# Patient Record
Sex: Female | Born: 1985 | Race: Black or African American | Hispanic: No | Marital: Single | Smoking: Never smoker
Health system: Southern US, Community
[De-identification: ages and names within clinical notes are randomized; demographics above are authoritative.]

## PROBLEM LIST (undated history)

## (undated) DIAGNOSIS — Z789 Other specified health status: Secondary | ICD-10-CM

## (undated) HISTORY — PX: TUBAL LIGATION: SHX77

---
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
## 2007-09-21 ENCOUNTER — Inpatient Hospital Stay (HOSPITAL_COMMUNITY): Admission: AD | Admit: 2007-09-21 | Discharge: 2007-09-21 | Payer: Self-pay | Admitting: Obstetrics & Gynecology

## 2007-12-14 ENCOUNTER — Ambulatory Visit (HOSPITAL_COMMUNITY): Admission: RE | Admit: 2007-12-14 | Discharge: 2007-12-14 | Payer: Self-pay | Admitting: Obstetrics and Gynecology

## 2008-05-13 ENCOUNTER — Ambulatory Visit (HOSPITAL_COMMUNITY): Admission: RE | Admit: 2008-05-13 | Discharge: 2008-05-13 | Payer: Self-pay | Admitting: Orthopedic Surgery

## 2008-05-16 ENCOUNTER — Ambulatory Visit: Payer: Self-pay | Admitting: Physician Assistant

## 2008-05-16 ENCOUNTER — Inpatient Hospital Stay (HOSPITAL_COMMUNITY): Admission: AD | Admit: 2008-05-16 | Discharge: 2008-05-19 | Payer: Self-pay | Admitting: Obstetrics & Gynecology

## 2010-10-15 ENCOUNTER — Emergency Department (HOSPITAL_COMMUNITY)
Admission: EM | Admit: 2010-10-15 | Discharge: 2010-10-15 | Payer: Self-pay | Source: Home / Self Care | Admitting: Emergency Medicine

## 2010-11-17 ENCOUNTER — Inpatient Hospital Stay (HOSPITAL_COMMUNITY)
Admission: AD | Admit: 2010-11-17 | Discharge: 2010-11-17 | Disposition: A | Discharge status: Still In Hospital | Payer: Self-pay | Source: Home / Self Care | Attending: Obstetrics and Gynecology | Admitting: Obstetrics and Gynecology

## 2010-11-23 LAB — URINE MICROSCOPIC-ADD ON

## 2010-11-23 LAB — CBC
HCT: 34.5 % — ABNORMAL LOW (ref 36.0–46.0)
Hemoglobin: 11.6 g/dL — ABNORMAL LOW (ref 12.0–15.0)
MCH: 28.5 pg (ref 26.0–34.0)
MCHC: 33.6 g/dL (ref 30.0–36.0)
MCV: 84.8 fL (ref 78.0–100.0)
Platelets: 208 10*3/uL (ref 150–400)
RBC: 4.07 MIL/uL (ref 3.87–5.11)
RDW: 13 % (ref 11.5–15.5)
WBC: 8.7 10*3/uL (ref 4.0–10.5)

## 2010-11-23 LAB — WET PREP, GENITAL
Trich, Wet Prep: NONE SEEN
Yeast Wet Prep HPF POC: NONE SEEN

## 2010-11-23 LAB — URINALYSIS, ROUTINE W REFLEX MICROSCOPIC
Bilirubin Urine: NEGATIVE
Hgb urine dipstick: NEGATIVE
Ketones, ur: NEGATIVE mg/dL
Nitrite: NEGATIVE
Protein, ur: NEGATIVE mg/dL
Specific Gravity, Urine: 1.025 (ref 1.005–1.030)
Urine Glucose, Fasting: NEGATIVE mg/dL
Urobilinogen, UA: 0.2 mg/dL (ref 0.0–1.0)
pH: 6.5 (ref 5.0–8.0)

## 2010-11-23 LAB — GC/CHLAMYDIA PROBE AMP, GENITAL
Chlamydia, DNA Probe: NEGATIVE
GC Probe Amp, Genital: NEGATIVE

## 2010-11-23 LAB — ABO/RH: ABO/RH(D): A POS

## 2010-12-03 LAB — URINE CULTURE
Colony Count: >=100000
Culture  Setup Time: 201201102229

## 2011-01-19 LAB — URINALYSIS, ROUTINE W REFLEX MICROSCOPIC
Bilirubin Urine: NEGATIVE
Glucose, UA: NEGATIVE mg/dL
Hgb urine dipstick: NEGATIVE
Ketones, ur: NEGATIVE mg/dL
Nitrite: NEGATIVE
Protein, ur: NEGATIVE mg/dL
Specific Gravity, Urine: 1.01 (ref 1.005–1.030)
Urobilinogen, UA: 0.2 mg/dL (ref 0.0–1.0)
pH: 6 (ref 5.0–8.0)

## 2011-01-19 LAB — URINE CULTURE
Colony Count: 30000
Culture  Setup Time: 201112090122

## 2011-01-19 LAB — POCT PREGNANCY, URINE: Preg Test, Ur: POSITIVE

## 2011-02-10 ENCOUNTER — Other Ambulatory Visit: Payer: Self-pay | Admitting: Family Medicine

## 2011-02-10 DIAGNOSIS — O3660X0 Maternal care for excessive fetal growth, unspecified trimester, not applicable or unspecified: Secondary | ICD-10-CM

## 2011-02-10 DIAGNOSIS — Z3689 Encounter for other specified antenatal screening: Secondary | ICD-10-CM

## 2011-02-10 DIAGNOSIS — O0930 Supervision of pregnancy with insufficient antenatal care, unspecified trimester: Secondary | ICD-10-CM

## 2011-02-16 ENCOUNTER — Ambulatory Visit (HOSPITAL_COMMUNITY)
Admission: RE | Admit: 2011-02-16 | Discharge: 2011-02-16 | Disposition: A | Discharge status: Home / Self Care | Payer: Medicaid Other | Source: Ambulatory Visit | Attending: Family Medicine | Admitting: Family Medicine

## 2011-02-16 DIAGNOSIS — O3660X0 Maternal care for excessive fetal growth, unspecified trimester, not applicable or unspecified: Secondary | ICD-10-CM | POA: Insufficient documentation

## 2011-02-16 DIAGNOSIS — O0930 Supervision of pregnancy with insufficient antenatal care, unspecified trimester: Secondary | ICD-10-CM

## 2011-02-16 DIAGNOSIS — Z3689 Encounter for other specified antenatal screening: Secondary | ICD-10-CM

## 2011-02-16 DIAGNOSIS — Z1389 Encounter for screening for other disorder: Secondary | ICD-10-CM | POA: Insufficient documentation

## 2011-02-16 DIAGNOSIS — Z363 Encounter for antenatal screening for malformations: Secondary | ICD-10-CM | POA: Insufficient documentation

## 2011-02-16 DIAGNOSIS — O358XX0 Maternal care for other (suspected) fetal abnormality and damage, not applicable or unspecified: Secondary | ICD-10-CM | POA: Insufficient documentation

## 2011-03-01 ENCOUNTER — Inpatient Hospital Stay (HOSPITAL_COMMUNITY)
Admission: AD | Admit: 2011-03-01 | Discharge: 2011-03-01 | Disposition: A | Discharge status: Home / Self Care | Payer: Medicaid Other | Source: Ambulatory Visit | Attending: Obstetrics & Gynecology | Admitting: Obstetrics & Gynecology

## 2011-03-01 DIAGNOSIS — W010XXA Fall on same level from slipping, tripping and stumbling without subsequent striking against object, initial encounter: Secondary | ICD-10-CM | POA: Insufficient documentation

## 2011-03-01 DIAGNOSIS — O99891 Other specified diseases and conditions complicating pregnancy: Secondary | ICD-10-CM | POA: Insufficient documentation

## 2011-03-01 DIAGNOSIS — Y92009 Unspecified place in unspecified non-institutional (private) residence as the place of occurrence of the external cause: Secondary | ICD-10-CM | POA: Insufficient documentation

## 2011-03-01 DIAGNOSIS — Y93F1 Activity, caregiving, bathing: Secondary | ICD-10-CM | POA: Insufficient documentation

## 2011-03-01 LAB — URINE MICROSCOPIC-ADD ON

## 2011-03-01 LAB — ABO/RH: ABO/RH(D): A POS

## 2011-03-01 LAB — URINALYSIS, ROUTINE W REFLEX MICROSCOPIC
Bilirubin Urine: NEGATIVE
Glucose, UA: NEGATIVE mg/dL
Hgb urine dipstick: NEGATIVE
Ketones, ur: NEGATIVE mg/dL
Nitrite: NEGATIVE
Protein, ur: NEGATIVE mg/dL
Specific Gravity, Urine: 1.025 (ref 1.005–1.030)
Urobilinogen, UA: 0.2 mg/dL (ref 0.0–1.0)
pH: 6.5 (ref 5.0–8.0)

## 2011-03-01 LAB — POCT PREGNANCY, URINE: Preg Test, Ur: POSITIVE

## 2011-04-09 ENCOUNTER — Inpatient Hospital Stay (HOSPITAL_COMMUNITY)
Admission: AD | Admit: 2011-04-09 | Discharge: 2011-04-10 | Disposition: A | Discharge status: Home / Self Care | Payer: Medicaid Other | Source: Ambulatory Visit | Attending: Obstetrics & Gynecology | Admitting: Obstetrics & Gynecology

## 2011-04-09 ENCOUNTER — Other Ambulatory Visit: Payer: Self-pay | Admitting: Family Medicine

## 2011-04-09 DIAGNOSIS — O4700 False labor before 37 completed weeks of gestation, unspecified trimester: Secondary | ICD-10-CM

## 2011-04-09 LAB — URINE MICROSCOPIC-ADD ON

## 2011-04-09 LAB — URINALYSIS, ROUTINE W REFLEX MICROSCOPIC
Bilirubin Urine: NEGATIVE
Glucose, UA: NEGATIVE mg/dL
Hgb urine dipstick: NEGATIVE
Ketones, ur: 15 mg/dL — AB
Nitrite: NEGATIVE
Protein, ur: NEGATIVE mg/dL
Specific Gravity, Urine: >1.03 — ABNORMAL HIGH (ref 1.005–1.030)
Urobilinogen, UA: 2 mg/dL — ABNORMAL HIGH (ref 0.0–1.0)
pH: 6.5 (ref 5.0–8.0)

## 2011-04-10 LAB — GC/CHLAMYDIA PROBE AMP, GENITAL
Chlamydia, DNA Probe: NEGATIVE
GC Probe Amp, Genital: NEGATIVE

## 2011-04-10 LAB — WET PREP, GENITAL
Clue Cells Wet Prep HPF POC: NONE SEEN
Trich, Wet Prep: NONE SEEN

## 2011-04-12 ENCOUNTER — Ambulatory Visit (HOSPITAL_COMMUNITY)
Admission: RE | Admit: 2011-04-12 | Discharge: 2011-04-12 | Disposition: A | Discharge status: Home / Self Care | Payer: Medicaid Other | Source: Ambulatory Visit | Attending: Family Medicine | Admitting: Family Medicine

## 2011-04-12 DIAGNOSIS — O3660X0 Maternal care for excessive fetal growth, unspecified trimester, not applicable or unspecified: Secondary | ICD-10-CM | POA: Insufficient documentation

## 2011-04-12 DIAGNOSIS — Z3689 Encounter for other specified antenatal screening: Secondary | ICD-10-CM | POA: Insufficient documentation

## 2011-04-15 ENCOUNTER — Other Ambulatory Visit: Payer: Self-pay | Admitting: Obstetrics & Gynecology

## 2011-04-15 DIAGNOSIS — O409XX0 Polyhydramnios, unspecified trimester, not applicable or unspecified: Secondary | ICD-10-CM

## 2011-04-15 LAB — POCT URINALYSIS DIP (DEVICE)
Glucose, UA: NEGATIVE mg/dL
Protein, ur: 30 mg/dL — AB
Urobilinogen, UA: 1 mg/dL (ref 0.0–1.0)

## 2011-04-19 ENCOUNTER — Ambulatory Visit (HOSPITAL_COMMUNITY): Admission: RE | Admit: 2011-04-19 | Payer: Self-pay | Source: Ambulatory Visit

## 2011-04-19 ENCOUNTER — Ambulatory Visit (HOSPITAL_COMMUNITY): Payer: Self-pay

## 2011-04-19 ENCOUNTER — Other Ambulatory Visit (HOSPITAL_COMMUNITY): Payer: Self-pay

## 2011-04-20 ENCOUNTER — Ambulatory Visit (HOSPITAL_COMMUNITY)
Admission: RE | Admit: 2011-04-20 | Discharge: 2011-04-20 | Disposition: A | Discharge status: Home / Self Care | Payer: Medicaid Other | Source: Ambulatory Visit | Attending: Obstetrics & Gynecology | Admitting: Obstetrics & Gynecology

## 2011-04-20 ENCOUNTER — Encounter (HOSPITAL_COMMUNITY): Payer: Self-pay

## 2011-04-20 DIAGNOSIS — O409XX0 Polyhydramnios, unspecified trimester, not applicable or unspecified: Secondary | ICD-10-CM | POA: Insufficient documentation

## 2011-04-20 DIAGNOSIS — O3660X0 Maternal care for excessive fetal growth, unspecified trimester, not applicable or unspecified: Secondary | ICD-10-CM | POA: Insufficient documentation

## 2011-04-26 ENCOUNTER — Other Ambulatory Visit: Payer: Self-pay | Admitting: Obstetrics & Gynecology

## 2011-04-26 ENCOUNTER — Other Ambulatory Visit: Payer: Self-pay

## 2011-04-26 ENCOUNTER — Ambulatory Visit (HOSPITAL_COMMUNITY)
Admission: RE | Admit: 2011-04-26 | Discharge: 2011-04-26 | Disposition: A | Discharge status: Home / Self Care | Payer: Medicaid Other | Source: Ambulatory Visit | Attending: Obstetrics & Gynecology | Admitting: Obstetrics & Gynecology

## 2011-04-26 ENCOUNTER — Other Ambulatory Visit: Payer: Medicaid Other

## 2011-04-26 ENCOUNTER — Ambulatory Visit: Payer: Medicaid Other

## 2011-04-26 DIAGNOSIS — O409XX0 Polyhydramnios, unspecified trimester, not applicable or unspecified: Secondary | ICD-10-CM

## 2011-04-26 DIAGNOSIS — Z3689 Encounter for other specified antenatal screening: Secondary | ICD-10-CM | POA: Insufficient documentation

## 2011-04-26 DIAGNOSIS — O4700 False labor before 37 completed weeks of gestation, unspecified trimester: Secondary | ICD-10-CM

## 2011-04-26 LAB — POCT URINALYSIS DIP (DEVICE)
Bilirubin Urine: NEGATIVE
Glucose, UA: NEGATIVE mg/dL
Nitrite: NEGATIVE
Urobilinogen, UA: 1 mg/dL (ref 0.0–1.0)

## 2011-04-26 LAB — GLUCOSE, CAPILLARY: Glucose-Capillary: 74 mg/dL (ref 70–99)

## 2011-04-27 ENCOUNTER — Ambulatory Visit: Payer: Medicaid Other

## 2011-04-27 ENCOUNTER — Inpatient Hospital Stay (HOSPITAL_COMMUNITY)
Admission: AD | Admit: 2011-04-27 | Discharge: 2011-04-27 | Disposition: A | Discharge status: Home / Self Care | Payer: Medicaid Other | Source: Ambulatory Visit | Attending: Obstetrics & Gynecology | Admitting: Obstetrics & Gynecology

## 2011-04-27 DIAGNOSIS — Z79899 Other long term (current) drug therapy: Secondary | ICD-10-CM | POA: Insufficient documentation

## 2011-04-27 DIAGNOSIS — O09219 Supervision of pregnancy with history of pre-term labor, unspecified trimester: Secondary | ICD-10-CM

## 2011-04-27 DIAGNOSIS — O4700 False labor before 37 completed weeks of gestation, unspecified trimester: Secondary | ICD-10-CM | POA: Insufficient documentation

## 2011-04-29 ENCOUNTER — Other Ambulatory Visit: Payer: Medicaid Other

## 2011-04-29 DIAGNOSIS — O409XX0 Polyhydramnios, unspecified trimester, not applicable or unspecified: Secondary | ICD-10-CM

## 2011-05-03 ENCOUNTER — Other Ambulatory Visit: Payer: Self-pay | Admitting: Family Medicine

## 2011-05-03 ENCOUNTER — Other Ambulatory Visit: Payer: Medicaid Other

## 2011-05-03 ENCOUNTER — Ambulatory Visit (HOSPITAL_COMMUNITY)
Admission: RE | Admit: 2011-05-03 | Discharge: 2011-05-03 | Disposition: A | Discharge status: Home / Self Care | Payer: Medicaid Other | Source: Ambulatory Visit | Attending: Obstetrics & Gynecology | Admitting: Obstetrics & Gynecology

## 2011-05-03 DIAGNOSIS — O409XX0 Polyhydramnios, unspecified trimester, not applicable or unspecified: Secondary | ICD-10-CM

## 2011-05-03 DIAGNOSIS — Z3689 Encounter for other specified antenatal screening: Secondary | ICD-10-CM | POA: Insufficient documentation

## 2011-05-03 LAB — POCT URINALYSIS DIP (DEVICE)
Glucose, UA: NEGATIVE mg/dL
Nitrite: NEGATIVE
Urobilinogen, UA: 1 mg/dL (ref 0.0–1.0)

## 2011-05-06 ENCOUNTER — Other Ambulatory Visit: Payer: Medicaid Other

## 2011-05-10 ENCOUNTER — Other Ambulatory Visit: Payer: Self-pay | Admitting: Obstetrics & Gynecology

## 2011-05-10 ENCOUNTER — Other Ambulatory Visit: Payer: Medicaid Other

## 2011-05-10 DIAGNOSIS — O409XX0 Polyhydramnios, unspecified trimester, not applicable or unspecified: Secondary | ICD-10-CM

## 2011-05-11 ENCOUNTER — Inpatient Hospital Stay (HOSPITAL_COMMUNITY)
Admission: AD | Admit: 2011-05-11 | Discharge: 2011-05-11 | Disposition: A | Discharge status: Home / Self Care | Payer: Medicaid Other | Source: Ambulatory Visit | Attending: Obstetrics & Gynecology | Admitting: Obstetrics & Gynecology

## 2011-05-11 DIAGNOSIS — O4700 False labor before 37 completed weeks of gestation, unspecified trimester: Secondary | ICD-10-CM | POA: Insufficient documentation

## 2011-05-11 LAB — URINALYSIS, ROUTINE W REFLEX MICROSCOPIC
Hgb urine dipstick: NEGATIVE
Leukocytes, UA: NEGATIVE
Nitrite: NEGATIVE
Protein, ur: NEGATIVE mg/dL
Urobilinogen, UA: 0.2 mg/dL (ref 0.0–1.0)

## 2011-05-12 ENCOUNTER — Inpatient Hospital Stay (HOSPITAL_COMMUNITY)
Admission: AD | Admit: 2011-05-12 | Discharge: 2011-05-15 | DRG: 767 | Disposition: A | Discharge status: Home / Self Care | Payer: Medicaid Other | Source: Ambulatory Visit | Attending: Obstetrics & Gynecology | Admitting: Obstetrics & Gynecology

## 2011-05-12 DIAGNOSIS — O864 Pyrexia of unknown origin following delivery: Secondary | ICD-10-CM

## 2011-05-12 DIAGNOSIS — O409XX0 Polyhydramnios, unspecified trimester, not applicable or unspecified: Secondary | ICD-10-CM | POA: Diagnosis present

## 2011-05-12 DIAGNOSIS — Z302 Encounter for sterilization: Secondary | ICD-10-CM

## 2011-05-12 DIAGNOSIS — Z22330 Carrier of Group B streptococcus: Secondary | ICD-10-CM

## 2011-05-12 DIAGNOSIS — O4290 Premature rupture of membranes, unspecified as to length of time between rupture and onset of labor, unspecified weeks of gestation: Principal | ICD-10-CM | POA: Diagnosis present

## 2011-05-12 DIAGNOSIS — O99892 Other specified diseases and conditions complicating childbirth: Secondary | ICD-10-CM | POA: Diagnosis present

## 2011-05-13 ENCOUNTER — Other Ambulatory Visit: Payer: Medicaid Other

## 2011-05-13 DIAGNOSIS — O409XX0 Polyhydramnios, unspecified trimester, not applicable or unspecified: Secondary | ICD-10-CM

## 2011-05-13 DIAGNOSIS — O4290 Premature rupture of membranes, unspecified as to length of time between rupture and onset of labor, unspecified weeks of gestation: Secondary | ICD-10-CM

## 2011-05-13 LAB — CBC
MCV: 80.7 fL (ref 78.0–100.0)
Platelets: 191 10*3/uL (ref 150–400)
RDW: 14 % (ref 11.5–15.5)
WBC: 8.9 10*3/uL (ref 4.0–10.5)

## 2011-05-14 ENCOUNTER — Other Ambulatory Visit: Payer: Self-pay | Admitting: Obstetrics and Gynecology

## 2011-05-14 LAB — CBC
HCT: 29.9 % — ABNORMAL LOW (ref 36.0–46.0)
MCV: 79.9 fL (ref 78.0–100.0)
RDW: 13.9 % (ref 11.5–15.5)
WBC: 16.1 10*3/uL — ABNORMAL HIGH (ref 4.0–10.5)

## 2011-05-14 MED ORDER — OXYTOCIN 20 UNITS IN LACTATED RINGERS INFUSION - SIMPLE: 125.0000 mL/h | INTRAVENOUS | Status: DC

## 2011-05-14 MED ORDER — SIMETHICONE 80 MG PO CHEW
80.0000 mg | CHEWABLE_TABLET | ORAL | Status: DC | PRN
  Filled 2011-05-14: qty 1

## 2011-05-14 MED ORDER — ONDANSETRON HCL 4 MG PO TABS: 4.0000 mg | ORAL_TABLET | ORAL | Status: DC | PRN

## 2011-05-14 MED ORDER — PSEUDOEPHEDRINE HCL 30 MG PO TABS: 60.0000 mg | ORAL_TABLET | ORAL | Status: DC | PRN

## 2011-05-14 MED ORDER — DIBUCAINE 1 % RE OINT: TOPICAL_OINTMENT | RECTAL | Status: DC | PRN

## 2011-05-14 MED ORDER — OXYCODONE-ACETAMINOPHEN 5-325 MG PO TABS
1.0000 | ORAL_TABLET | ORAL | Status: DC | PRN
  Administered 2011-05-15: 1 via ORAL

## 2011-05-14 MED ORDER — BISACODYL 10 MG RE SUPP: 10.0000 mg | Freq: Every day | RECTAL | Status: DC | PRN

## 2011-05-14 MED ORDER — METHYLERGONOVINE MALEATE 0.2 MG/ML IJ SOLN: 0.2000 mg | Freq: Four times a day (QID) | INTRAMUSCULAR | Status: DC | PRN

## 2011-05-14 MED ORDER — FAMOTIDINE 20 MG PO TABS: 20.0000 mg | ORAL_TABLET | ORAL | Status: DC | PRN

## 2011-05-14 MED ORDER — MAGNESIUM HYDROXIDE 400 MG/5ML PO SUSP: 30.0000 mL | ORAL | Status: DC | PRN

## 2011-05-14 MED ORDER — FLEET ENEMA 7-19 GM/118ML RE ENEM
1.0000 | ENEMA | Freq: Every day | RECTAL | Status: DC | PRN
  Filled 2011-05-14: qty 1

## 2011-05-14 MED ORDER — FERROUS SULFATE 325 (65 FE) MG PO TABS
325.0000 mg | ORAL_TABLET | Freq: Two times a day (BID) | ORAL | Status: DC
  Administered 2011-05-15: 325 mg via ORAL
  Filled 2011-05-14: qty 1

## 2011-05-14 MED ORDER — WITCH HAZEL-GLYCERIN EX PADS
MEDICATED_PAD | CUTANEOUS | Status: DC | PRN
  Filled 2011-05-14: qty 100

## 2011-05-14 MED ORDER — ZOLPIDEM TARTRATE 5 MG PO TABS: 5.0000 mg | ORAL_TABLET | Freq: Every evening | ORAL | Status: DC | PRN

## 2011-05-14 MED ORDER — PRENATAL PLUS 27-1 MG PO TABS
1.0000 | ORAL_TABLET | Freq: Every day | ORAL | Status: DC
  Administered 2011-05-15: 1 via ORAL
  Filled 2011-05-14: qty 1

## 2011-05-14 MED ORDER — MENTHOL 3 MG MT LOZG: 1.0000 | LOZENGE | OROMUCOSAL | Status: DC | PRN

## 2011-05-14 MED ORDER — SENNOSIDES-DOCUSATE SODIUM 8.6-50 MG PO TABS
1.0000 | ORAL_TABLET | Freq: Every day | ORAL | Status: DC
  Filled 2011-05-14: qty 2

## 2011-05-14 MED ORDER — METHYLERGONOVINE MALEATE 0.2 MG PO TABS: 0.2000 mg | ORAL_TABLET | ORAL | Status: DC | PRN

## 2011-05-14 MED ORDER — GUAIFENESIN 100 MG/5ML PO SOLN: 15.0000 mL | ORAL | Status: DC | PRN

## 2011-05-14 MED ORDER — SODIUM CHLORIDE 0.9 % IJ SOLN
3.0000 mL | INTRAMUSCULAR | Status: DC | PRN
  Filled 2011-05-14: qty 3

## 2011-05-14 MED ORDER — IBUPROFEN 600 MG PO TABS
600.0000 mg | ORAL_TABLET | Freq: Four times a day (QID) | ORAL | Status: DC
  Administered 2011-05-15 (×2): 600 mg via ORAL
  Filled 2011-05-14: qty 1

## 2011-05-14 MED ORDER — DIPHENHYDRAMINE HCL 25 MG PO CAPS: 25.0000 mg | ORAL_CAPSULE | Freq: Four times a day (QID) | ORAL | Status: DC | PRN

## 2011-05-14 MED ORDER — PIPERACILLIN-TAZOBACTAM 3.375 G IVPB
3.3750 g | Freq: Three times a day (TID) | INTRAVENOUS | Status: DC
  Administered 2011-05-15: 3.375 g via INTRAVENOUS
  Filled 2011-05-14 (×4): qty 50

## 2011-05-14 MED ORDER — HYDROCORTISONE ACE-PRAMOXINE 1-1 % RE FOAM: 1.0000 | RECTAL | Status: DC | PRN

## 2011-05-14 MED ORDER — SODIUM CHLORIDE 0.9 % IJ SOLN
3.0000 mL | Freq: Two times a day (BID) | INTRAMUSCULAR | Status: DC
  Administered 2011-05-15: 3 mL via INTRAVENOUS
  Filled 2011-05-14 (×3): qty 3

## 2011-05-14 MED ORDER — ONDANSETRON HCL 4 MG/2ML IJ SOLN: 4.0000 mg | INTRAMUSCULAR | Status: DC | PRN

## 2011-05-14 MED ORDER — CALCIUM CARBONATE ANTACID 500 MG PO CHEW: 1.0000 | CHEWABLE_TABLET | ORAL | Status: DC | PRN

## 2011-05-14 MED ORDER — BENZOCAINE-MENTHOL 20-0.5 % EX AERO: 1.0000 "application " | INHALATION_SPRAY | Freq: Four times a day (QID) | CUTANEOUS | Status: DC | PRN

## 2011-05-15 DIAGNOSIS — O864 Pyrexia of unknown origin following delivery: Secondary | ICD-10-CM

## 2011-05-15 MED ORDER — IBUPROFEN 600 MG PO TABS: 600.0000 mg | ORAL_TABLET | Freq: Four times a day (QID) | ORAL | Status: AC

## 2011-05-15 MED ORDER — OXYCODONE-ACETAMINOPHEN 5-325 MG PO TABS: 1.0000 | ORAL_TABLET | ORAL | Status: AC | PRN

## 2011-05-15 NOTE — Progress Notes (Signed)
Post Partum Day 2 Subjective: no complaints, up ad lib, voiding, tolerating PO, + flatus and Incisional Pain at Umbilicus  Objective: Blood pressure 142/91, pulse 74, temperature 97.6 F (36.4 C), temperature source Oral, resp. rate 20.  Physical Exam:  General: alert, cooperative and no distress Lochia: appropriate Uterine Fundus: firm Incision: healing well, no significant drainage, no dehiscence, no significant erythema DVT Evaluation: No evidence of DVT seen on physical exam.   Basename 05/14/11 0530 05/13/11 0010  HGB 9.8* 9.7*  HCT 29.9* 29.7*    Assessment/Plan: Discharge home and Contraception Bilateral Tubal Ligation   LOS: 3 days   [facility] 05/15/2011, 10:54 AM

## 2011-05-15 NOTE — Discharge Summary (Signed)
Obstetric Discharge Summary Reason for Admission: onset of labor Prenatal Procedures: NST Intrapartum Procedures: spontaneous vaginal delivery Postpartum Procedures: P.P. tubal ligation Complications-Operative and Postpartum: pelvic infection, hemorrhage and PPH 750cc, treated with Cytotec;  Developed fever 100.7 postpartum, treated with Zosyn with resolution,  Hemoglobin  Date Value Range Status  05/14/2011 9.8* 12.0-15.0 (g/dL) Final     HCT  Date Value Range Status  05/14/2011 29.9* 36.0-46.0 (%) Final    Discharge Diagnoses: Term Pregnancy-delivered and Postpartum Hemorrhage and Postpartum Fever  Discharge Information: Date: 05/15/2011 Activity: unrestricted and pelvic rest Diet: routine Medications: PNV, Ibuprophen, Iron and Percocet Condition: stable Instructions: refer to practice specific booklet Discharge to: home   Newborn Data: Live born  Information for the patient's newborn:  Lilia, Letterman [[id number]]  female ; APGAR , ; weight ;  Home with mother.  [facility] 05/15/2011, 11:04 AM

## 2011-05-16 LAB — MRSA CULTURE

## 2011-05-17 ENCOUNTER — Other Ambulatory Visit: Payer: Medicaid Other

## 2011-05-20 NOTE — Op Note (Signed)
  NAMEIDIL, MASLANKA             ACCOUNT NO.:  [id number]  MEDICAL RECORD NO.:  [id number]  LOCATION:  9121                          FACILITY:  [facility]  PHYSICIAN:  Catalina Antigua, MD     DATE OF BIRTH:  [birth year]  DATE OF PROCEDURE:  05/14/2011 DATE OF DISCHARGE:                              OPERATIVE REPORT   PREOPERATIVE DIAGNOSIS:  A 25 year old gravida 3, para 1-1-1-2 who is status post spontaneous vaginal delivery at 35 weeks secondary to term premature rupture of membranes with undesired fertility.  POSTOPERATIVE DIAGNOSIS:  A 25 year old gravida 3, para 1-1-1-2 who is status post spontaneous vaginal delivery at 35 weeks secondary to term premature rupture of membranes with undesired fertility.  PROCEDURE:  Postpartum tubal ligation.  SURGEON:  Catalina Antigua, MD  ASSISTANT:  None.  ANESTHESIA:  Epidural.  COMPLICATIONS:  None.  ESTIMATED BLOOD LOSS:  Minimal.  SPECIMEN COLLECTED:  Portions of right and left fallopian tubes, and they were sent to Pathology.  FINDINGS:  Grossly normal tubes and ovaries x2.  After informed consent was obtained, the patient was taken to the operating room where anesthesia was induced and found to be adequate. The patient was prepped and draped in the usual sterile fashion and placed in dorsal supine position.  Approximately 1.5 cm infraumbilical skin incision was made with a scalpel and carried down to underlying layer of fascia.  The fascia was grasped with Kocher clamps, tented up and incised with Mayo scissors.  The fascial incision was tagged with 0 Vicryl.  The underlying layer of peritoneum was identified, tented up, and entered bluntly.  The [employer] retractor were introduced into the abdominal cavity.  The left fallopian tube was identified, grasped with a Babcock clamp, identified to its fimbriated end and approximately a 2- cm segment was suture ligated x2 and transected, excellent hemostasis was visualized.  The tube  was returned to the abdomen.  Similarly, the right fallopian tube was grasped with Babcock clamp, identified to its fimbriated end, and a 1.5-cm segment was suture ligated x2 and transected.  Again, hemostasis was maintained.  The fascia was reapproximated with 0 Vicryl.  The skin was closed in a subcuticular fashion with 3-0 Vicryl.  A 0.25% Marcaine solution was injected in the incision site. The patient tolerated the procedure well and was transferred to recovery room in stable condition.  Sponge, lap, and needle count were correct x2.     Catalina Antigua, MD     PC/MEDQ  D:  05/14/2011  T:  05/15/2011  Job:  [id number]  Electronically Signed by Catalina Antigua  on 05/20/2011 08:08:34 AM

## 2011-05-24 ENCOUNTER — Encounter (HOSPITAL_COMMUNITY): Payer: Self-pay | Admitting: Anesthesiology

## 2011-05-24 NOTE — Anesthesia Preprocedure Evaluation (Signed)
Anesthesia Evaluation  Name, MR# and DOB Patient awake  General Assessment Comment  Reviewed: Allergy & Precautions, H&P , Patient's Chart, lab work & pertinent test results and reviewed documented beta blocker date and time   Airway Mallampati: I TM Distance: >3 FB Neck ROM: full    Dental  (+) Teeth Intact   Pulmonaryneg pulmonary ROS    clear to auscultation    Cardiovascular regular Normal   Neuro/PsychNegative Neurological ROS Negative Psych ROS  GI/Hepatic/Renal negative GI ROS, negative Liver ROS, and negative Renal ROS (+)       Endo/Other  Negative Endocrine ROS (+)   Abdominal   Musculoskeletal  Hematology negative hematology ROS (+)   Peds  Reproductive/Obstetrics negative OB ROS   Anesthesia Other Findings             Anesthesia Physical Anesthesia Plan  ASA: II  Anesthesia Plan:    Post-op Pain Management:    Induction:   Airway Management Planned:   Additional Equipment:   Intra-op Plan:   Post-operative Plan:   Informed Consent: I have reviewed the patients History and Physical, chart, labs and discussed the procedure including the risks, benefits and alternatives for the proposed anesthesia with the patient or authorized representative who has indicated his/her understanding and acceptance.   Dental Advisory Given and History available from chart only  Plan Discussed with: CRNA  Anesthesia Plan Comments:         Anesthesia Quick Evaluation

## 2011-05-26 NOTE — Addendum Note (Signed)
Addendum  created 05/26/11 2005 by Tyrone Apple. Chalon Zobrist   Modules edited:Anesthesia Blocks and Procedures, Inpatient Notes

## 2011-05-26 NOTE — Addendum Note (Signed)
Addendum  created 05/26/11 2208 by Tyrone Apple. Leah Richardson   Modules edited:Anesthesia Responsible Staff

## 2011-05-26 NOTE — Anesthesia Procedure Notes (Signed)
Procedures

## 2011-06-17 ENCOUNTER — Ambulatory Visit: Payer: Medicaid Other | Admitting: Family Medicine

## 2011-06-18 ENCOUNTER — Inpatient Hospital Stay (HOSPITAL_COMMUNITY)
Admission: AD | Admit: 2011-06-18 | Payer: Medicaid Other | Source: Ambulatory Visit | Admitting: Obstetrics & Gynecology

## 2011-08-05 LAB — URINALYSIS, ROUTINE W REFLEX MICROSCOPIC
Glucose, UA: NEGATIVE
pH: 6.5

## 2011-08-05 LAB — CBC
HCT: 33.1 — ABNORMAL LOW
Platelets: 182
RDW: 15.1

## 2011-08-05 LAB — COMPREHENSIVE METABOLIC PANEL
Albumin: 2.6 — ABNORMAL LOW
Alkaline Phosphatase: 171 — ABNORMAL HIGH
BUN: 5 — ABNORMAL LOW
Calcium: 8.4
Potassium: 3.8
Sodium: 136
Total Protein: 5.8 — ABNORMAL LOW

## 2011-08-05 LAB — URINE MICROSCOPIC-ADD ON

## 2011-08-05 LAB — URIC ACID: Uric Acid, Serum: 4.6

## 2011-08-05 LAB — PLATELET COUNT: Platelets: 180

## 2011-08-17 LAB — URINE MICROSCOPIC-ADD ON

## 2011-08-17 LAB — URINALYSIS, ROUTINE W REFLEX MICROSCOPIC
Bilirubin Urine: NEGATIVE
Ketones, ur: NEGATIVE
Nitrite: NEGATIVE
Protein, ur: NEGATIVE
Specific Gravity, Urine: 1.02
Urobilinogen, UA: 0.2

## 2011-08-17 LAB — WET PREP, GENITAL
Trich, Wet Prep: NONE SEEN
Yeast Wet Prep HPF POC: NONE SEEN

## 2011-08-17 LAB — GC/CHLAMYDIA PROBE AMP, GENITAL
Chlamydia, DNA Probe: POSITIVE — AB
GC Probe Amp, Genital: NEGATIVE

## 2011-08-17 LAB — URINE CULTURE: Colony Count: 50000

## 2011-08-17 LAB — POCT PREGNANCY, URINE
Operator id: 242691
Preg Test, Ur: POSITIVE

## 2011-10-26 ENCOUNTER — Inpatient Hospital Stay (HOSPITAL_COMMUNITY)
Admission: AD | Admit: 2011-10-26 | Discharge: 2011-10-26 | Disposition: A | Discharge status: Home / Self Care | Payer: Self-pay | Source: Ambulatory Visit | Attending: Obstetrics & Gynecology | Admitting: Obstetrics & Gynecology

## 2011-10-26 ENCOUNTER — Encounter (HOSPITAL_COMMUNITY): Payer: Self-pay | Admitting: *Deleted

## 2011-10-26 DIAGNOSIS — R197 Diarrhea, unspecified: Secondary | ICD-10-CM | POA: Insufficient documentation

## 2011-10-26 DIAGNOSIS — K529 Noninfective gastroenteritis and colitis, unspecified: Secondary | ICD-10-CM

## 2011-10-26 DIAGNOSIS — K5289 Other specified noninfective gastroenteritis and colitis: Secondary | ICD-10-CM

## 2011-10-26 DIAGNOSIS — R109 Unspecified abdominal pain: Secondary | ICD-10-CM | POA: Insufficient documentation

## 2011-10-26 HISTORY — DX: Other specified health status: Z78.9

## 2011-10-26 LAB — URINALYSIS, ROUTINE W REFLEX MICROSCOPIC
Leukocytes, UA: NEGATIVE
Nitrite: NEGATIVE
Specific Gravity, Urine: 1.025 (ref 1.005–1.030)
Urobilinogen, UA: 1 mg/dL (ref 0.0–1.0)
pH: 6.5 (ref 5.0–8.0)

## 2011-10-26 MED ORDER — PROMETHAZINE HCL 25 MG PO TABS: 25.0000 mg | ORAL_TABLET | Freq: Four times a day (QID) | ORAL | Status: AC | PRN

## 2011-10-26 MED ORDER — ONDANSETRON 4 MG PO TBDP
4.0000 mg | ORAL_TABLET | Freq: Once | ORAL | Status: AC
  Administered 2011-10-26: 4 mg via ORAL
  Filled 2011-10-26: qty 1

## 2011-10-26 NOTE — ED Provider Notes (Signed)
Attestation of Attending Supervision of Advanced Practitioner: Evaluation and management procedures were performed by the PA/NP/CNM/OB Fellow under my supervision/collaboration. Chart reviewed, and agree with management and plan.  Jerman Tinnon, M.D. 10/26/2011 10:00 AM   

## 2011-10-26 NOTE — Progress Notes (Signed)
Abd pain started yesterday morning, pain started out as upper abd pain, now in lower abd.  Nausea, no vomitting, has had some diarrhea.  No fever or vaginal bleeding.

## 2011-10-26 NOTE — ED Provider Notes (Signed)
History   Pt presents today c/o diarrhea with abd cramping that began last night. She has also had some nausea. She denies fever, vag dc, bleeding, or any other sx at this time.  Chief Complaint  Patient presents with  . Abdominal Pain   HPI  OB History    Grav Para Term Preterm Abortions TAB SAB Ect Mult Living   4 2   1 1    2       Past Medical History  Diagnosis Date  . No pertinent past medical history     Past Surgical History  Procedure Date  . Tubal ligation     No family history on file.  History  Substance Use Topics  . Smoking status: Never Smoker   . Smokeless tobacco: Not on file  . Alcohol Use: No    Allergies: No Known Allergies  Prescriptions prior to admission  Medication Sig Dispense Refill  . ferrous sulfate 325 (65 FE) MG tablet Take 325 mg by mouth daily.       . Prenatal Vit-Fe Psac Cmplx-FA (PRENATAL MULTIVITAMIN) 60-1 MG tablet Take 1 tablet by mouth daily.          Review of Systems  Constitutional: Negative for fever and chills.  Eyes: Negative for blurred vision.  Respiratory: Negative for cough, sputum production, shortness of breath and wheezing.   Cardiovascular: Negative for chest pain.  Gastrointestinal: Positive for nausea, abdominal pain and diarrhea. Negative for vomiting and constipation.  Genitourinary: Negative for dysuria, urgency, frequency and hematuria.  Neurological: Negative for dizziness and headaches.  Psychiatric/Behavioral: Negative for depression and suicidal ideas.   Physical Exam   Blood pressure 127/92, pulse 73, temperature 98.2 F (36.8 C), temperature source Oral, resp. rate 18, height 5\' 4"  (1.626 m), weight 148 lb 3.2 oz (67.223 kg), last menstrual period 10/17/2011, unknown if currently breastfeeding.  Physical Exam  Nursing note and vitals reviewed. Constitutional: She is oriented to person, place, and time. She appears well-developed and well-nourished. No distress.  HENT:  Head: Normocephalic and  atraumatic.  Eyes: EOM are normal. Pupils are equal, round, and reactive to light.  GI: Soft. Bowel sounds are normal. She exhibits no distension. There is no tenderness. There is no rebound and no guarding.  Neurological: She is alert and oriented to person, place, and time.  Skin: Skin is warm and dry. She is not diaphoretic.  Psychiatric: She has a normal mood and affect. Her behavior is normal. Judgment and thought content normal.    MAU Course  Procedures  Results for orders placed during the hospital encounter of 10/26/11 (from the past 24 hour(s))  POCT PREGNANCY, URINE     Status: Normal   Collection Time   10/26/11  9:36 AM      Component Value Range   Preg Test, Ur NEGATIVE      Results for orders placed during the hospital encounter of 10/26/11 (from the past 24 hour(s))  URINALYSIS, ROUTINE W REFLEX MICROSCOPIC     Status: Normal   Collection Time   10/26/11  9:20 AM      Component Value Range   Color, Urine YELLOW  YELLOW    APPearance CLEAR  CLEAR    Specific Gravity, Urine 1.025  1.005 - 1.030    pH 6.5  5.0 - 8.0    Glucose, UA NEGATIVE  NEGATIVE (mg/dL)   Hgb urine dipstick NEGATIVE  NEGATIVE    Bilirubin Urine NEGATIVE  NEGATIVE    Ketones, ur  NEGATIVE  NEGATIVE (mg/dL)   Protein, ur NEGATIVE  NEGATIVE (mg/dL)   Urobilinogen, UA 1.0  0.0 - 1.0 (mg/dL)   Nitrite NEGATIVE  NEGATIVE    Leukocytes, UA NEGATIVE  NEGATIVE   POCT PREGNANCY, URINE     Status: Normal   Collection Time   10/26/11  9:36 AM      Component Value Range   Preg Test, Ur NEGATIVE      Assessment and Plan  Gastroenteritis: discussed with pt at length. She will use Imodium for diarrhea. Will give Rx for phenergan. She will f/u with her PCP. Discussed diet, activity, risks, and precautions.  Clinton Gallant. Eulanda Dorion III, DrHSc, MPAS, PA-C  10/26/2011, 9:39 AM   Henrietta Hoover, PA 10/26/11 [phone]

## 2012-06-30 IMAGING — US US OB DETAIL+14 WK
1 series · 12 of 28 positions shown · non-contrast
Comparison: none

[Series 1: us ob detail +14 wk · 12 of 101 slices shown]
[im 4/101]
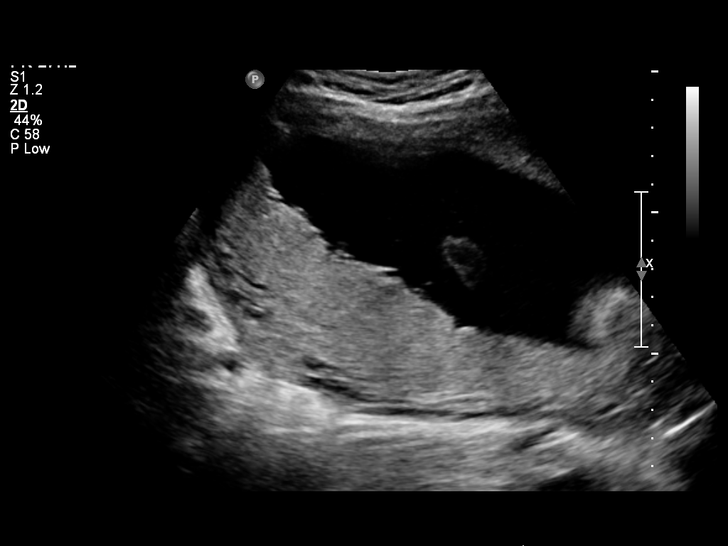
[im 12/101]
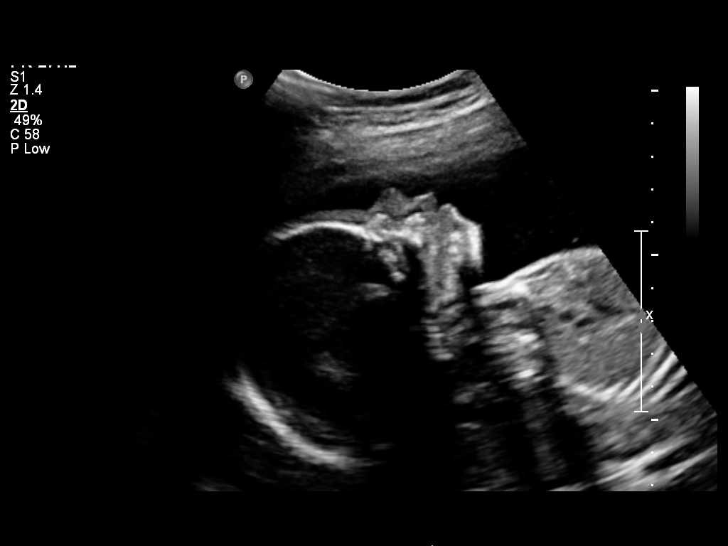
[im 19/101]
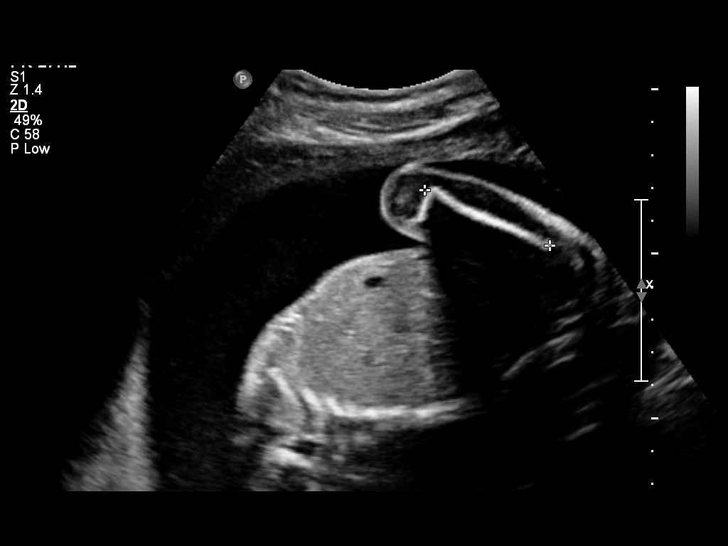
[im 30/101]
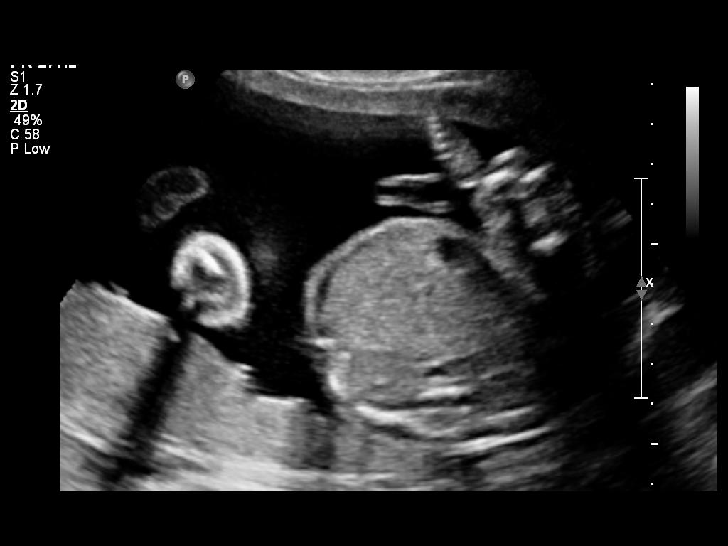
[im 38/101]
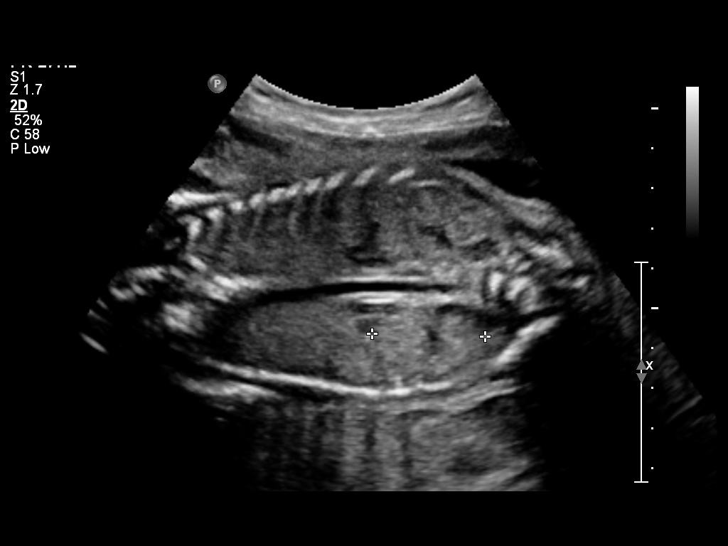
[im 45/101]
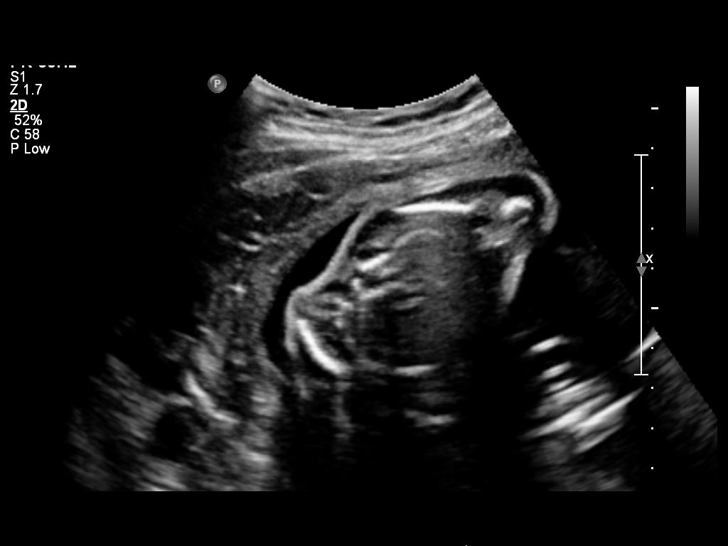
[im 56/101]
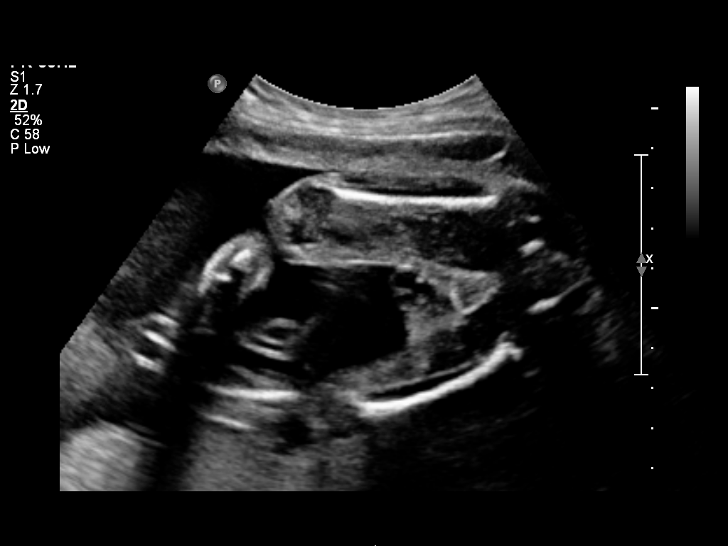
[im 63/101]
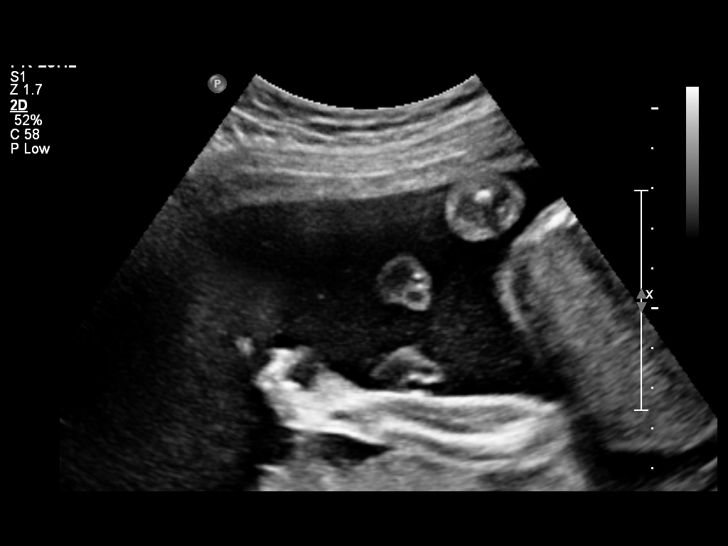
[im 71/101]
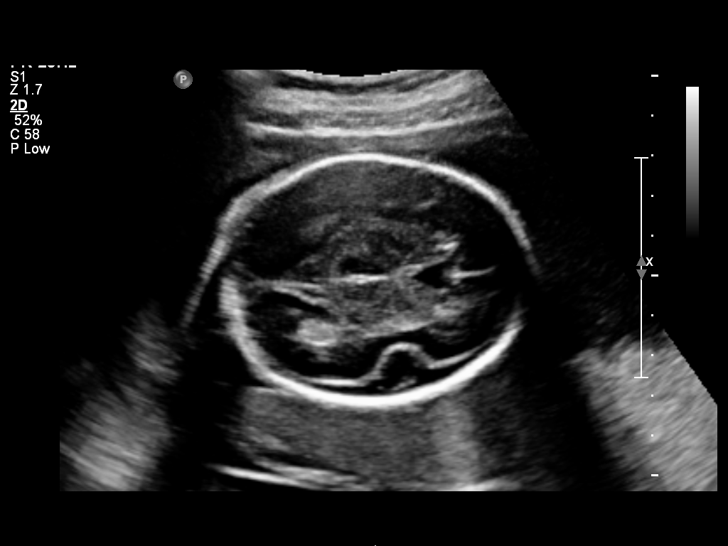
[im 82/101]
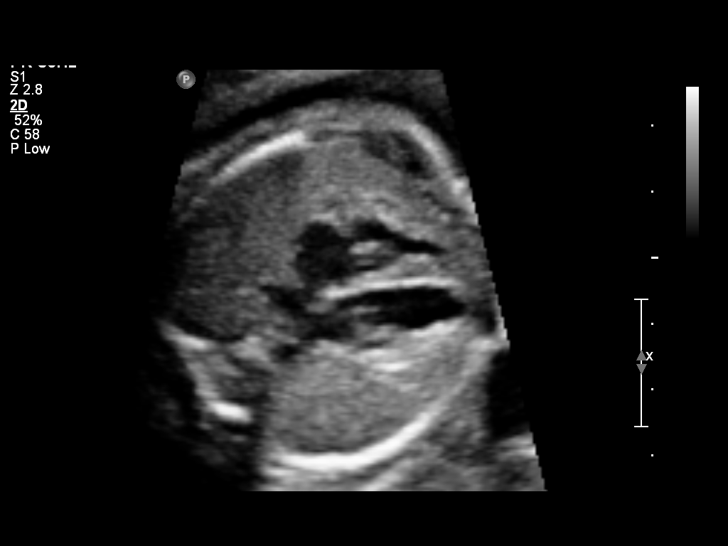
[im 89/101]
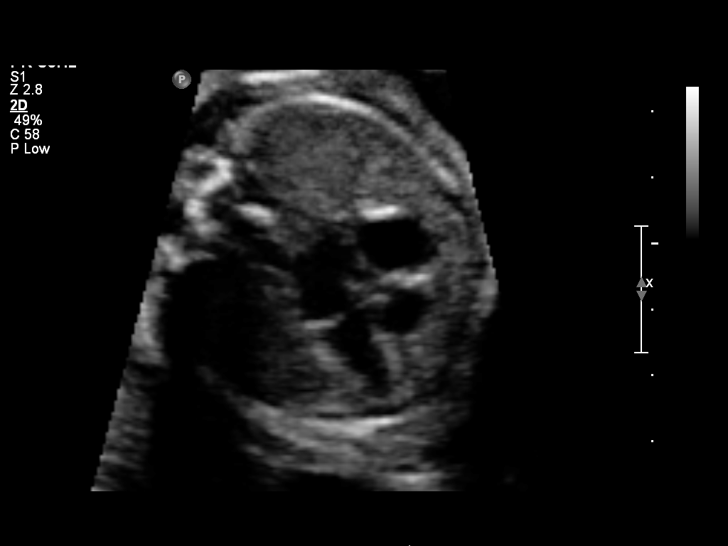
[im 97/101]
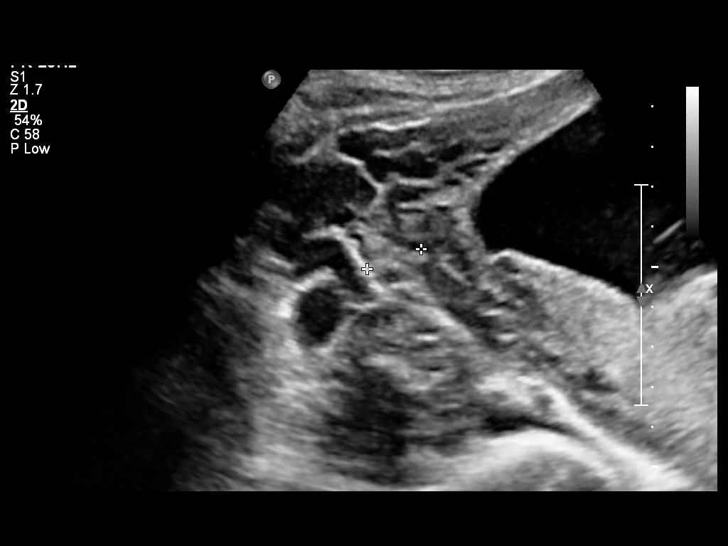

[12 of 28 positions shown; findings below may reference images not displayed]

OBSTETRICS REPORT
                      (Signed Final 02/16/2011 [DATE])

 Order#:         [id number]
Procedures

 US OB DETAIL + 14 WK                                  76811.0
Indications

 Detailed fetal anatomic survey
 Size greater than dates (Large for gestational [AGE]
Fetal Evaluation

 Fetal Heart Rate:  136                          bpm
 Cardiac Activity:  Observed
 Presentation:      Variable
 Placenta:          Posterior, above cervical
                    os
 P. Cord            Visualized
 Insertion:

 Amniotic Fluid
 AFI FV:      Subjectively within normal limits
                                             Larg Pckt:       7  cm
Biometry

 BPD:       61  mm     G. Age:  24w 6d                CI:        71.67   70 - 86
                                                      FL/HC:      18.5   19.2 -

 HC:     229.4  mm     G. Age:  25w 0d     > 97  %    HC/AC:      1.13   1.05 -

 AC:     202.2  mm     G. Age:  24w 6d       95  %    FL/BPD:     69.7   71 - 87
 FL:      42.5  mm     G. Age:  23w 6d       80  %    FL/AC:      21.0   20 - 24
 HUM:     41.6  mm     G. Age:  25w 1d     > 95  %
 CER:     25.7  mm     G. Age:  23w 5d       67  %

 Est. FW:     703  gm      1 lb 9 oz     77  %
Gestational Age

 LMP:           22w 4d        Date:  09/11/10                 EDD:   06/18/11
 U/S Today:     24w 5d                                        EDD:   06/03/11
 Best:          22w 4d     Det. By:  LMP  (09/11/10)          EDD:   06/18/11
Genetic Sonogram - Trisomy 21 Screening
 Age:                                             25          Risk=1:   885
 Echogenic bowel:                                 No          LR :
 Hypoplastic/absent Nasal bone:                   No
 Choroid plexus cysts:                            No
 Structural anomalies (inc. cardiac):             No          LR :
 Hypoplastic / absent midphalanx 5th Digit:       No
 Short femur:                                     No          LR :
 Wide space 6st-6nd toes:                         No
 Short humerus:                                   No          LR :
 2-vessel umbilical cord:                         No
 Pyelectasis:                                     No          LR :
 Echogenic cardiac foci:                          No          LR :

 11 Of 11 Criteria Were Visualized and 0 Abnormal(s) Were Seen.
 Ultrasound Modified Risk for Fetal Down Syndrome = [DATE]
Anatomy

 Cranium:           Appears normal      Aortic Arch:       Not well
                                                           visualized
 Fetal Cavum:       Appears normal      Ductal Arch:       Not well
                                                           visualized
 Ventricles:        Appears normal      Diaphragm:         Appears normal
 Choroid Plexus:    Appears normal      Stomach:           Appears normal
 Cerebellum:        Appears normal      Abdomen:           Appears normal
 Posterior Fossa:   Appears normal      Abdominal Wall:    Appears nml
                                                           (cord insert,
                                                           abd wall)
 Nuchal Fold:       Not applicable      Cord Vessels:      Appears normal
                    (>20 wks GA)                           (3 vessel cord)
 Face:              Appears normal      Kidneys:           Appear normal
                    (lips/profile/orbit
                    s)
 Heart:             Appears normal      Bladder:           Appears normal
                    (4 chamber &
                    axis)
 RVOT:              Appears normal      Spine:             Appears normal
 LVOT:              Appears normal      Limbs:             Appears normal
                                                           (hands, ankles,
                                                           feet)

 Other:     Heels and 5th digit visualized. Nasal bone visualized.
            Fetus appears to be a male. Technically difficult due to
            fetal position.
Cervix Uterus Adnexa

 Cervical Length:    4.5      cm

 Cervix:       Normal appearance by transabdominal scan.
 Left Ovary:    Within normal limits.
 Right Ovary:   Within normal limits.
Impression

  Single living intrauterine gestation with concordant
 gestational age and normal visualized anatomy. No
 sonographic markers for aneuploidy visualized;  see above
 for US modified Trisomy 21 risk calculation.

## 2012-09-01 IMAGING — US US FETAL BPP W/O NONSTRESS
2 series · 13 of 28 positions shown · non-contrast
Comparison: none

[Series 1: us fetal bpp w/o nonstress · non-contrast · 2 of 6 slices shown (1 of 2)]
[im 2/6]
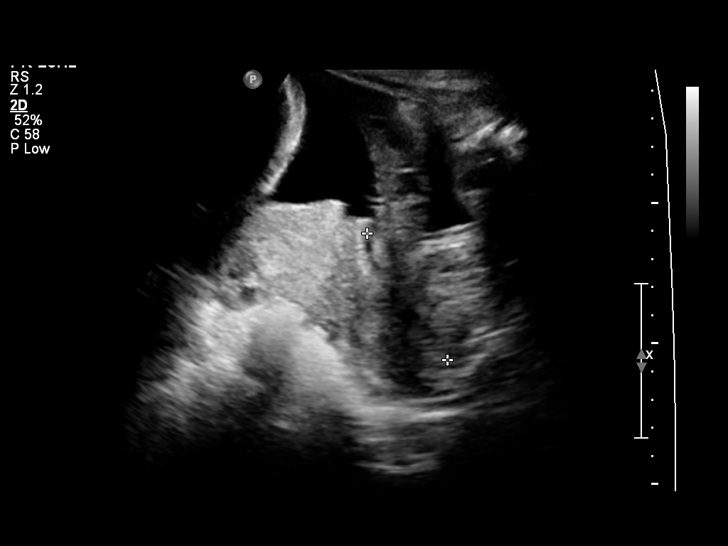
[im 4/6]
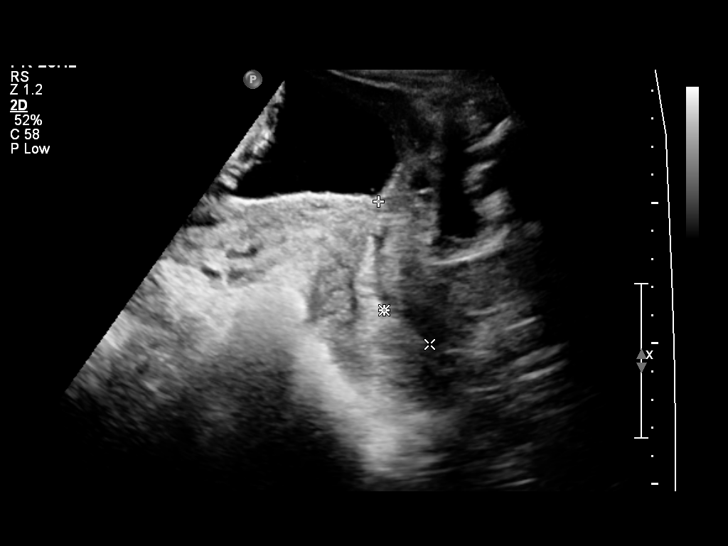

[Series 1: us fetal bpp w/o nonstress · non-contrast · 11 of 26 slices shown (2 of 2)]
[im 1/26]
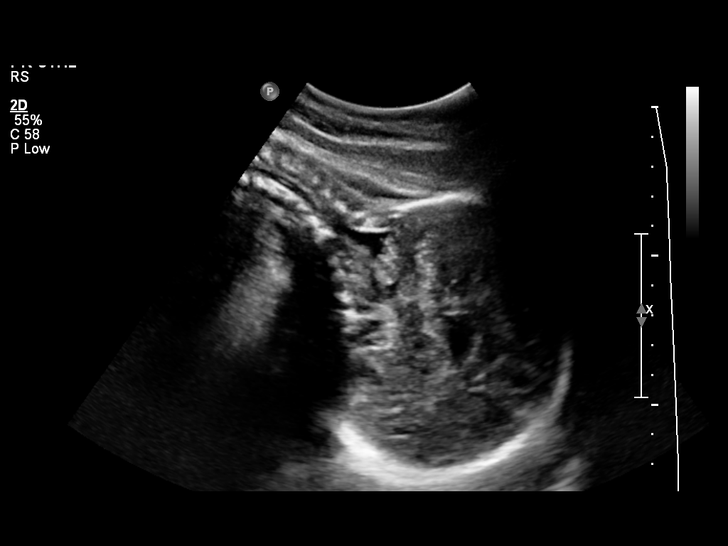
[im 3/26]
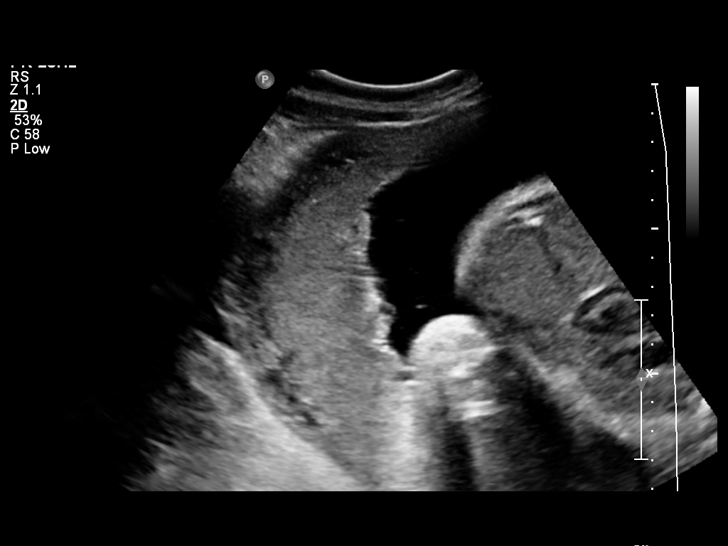
[im 5/26]
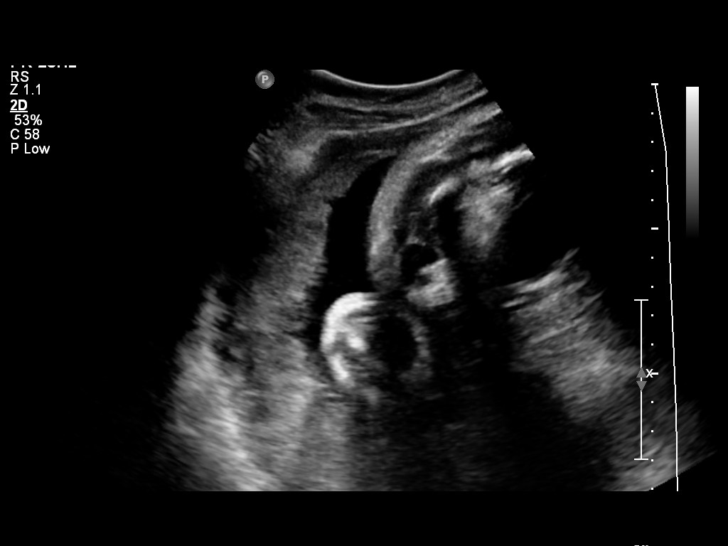
[im 7/26]
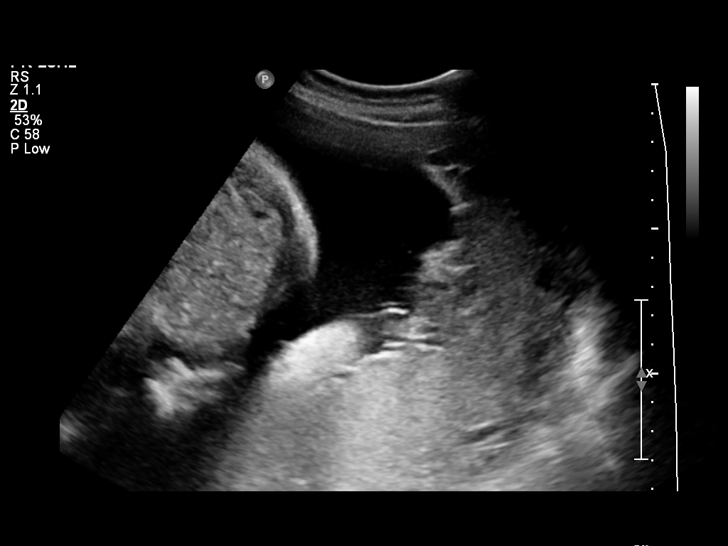
[im 11/26]
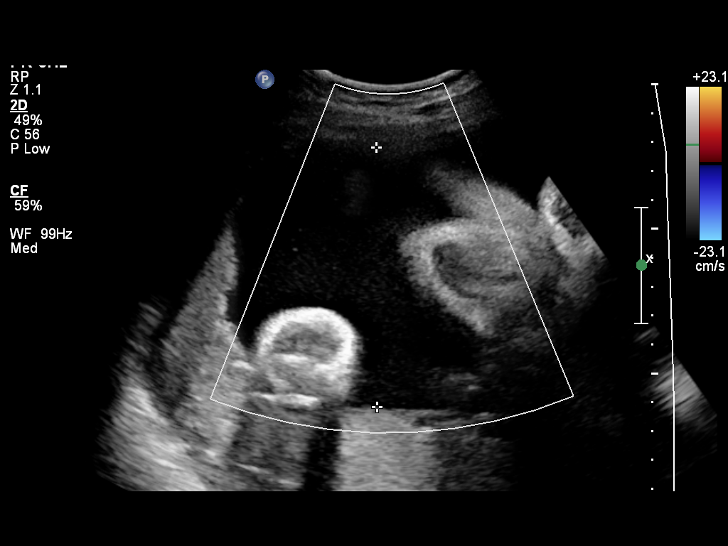
[im 13/26]
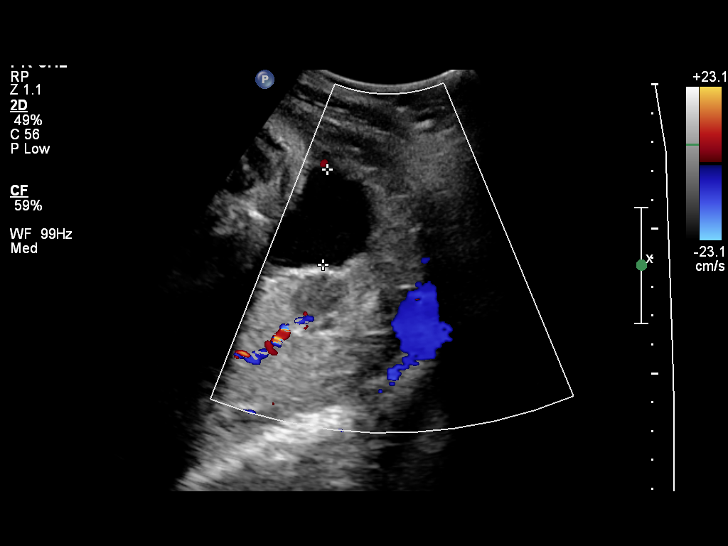
[im 15/26]
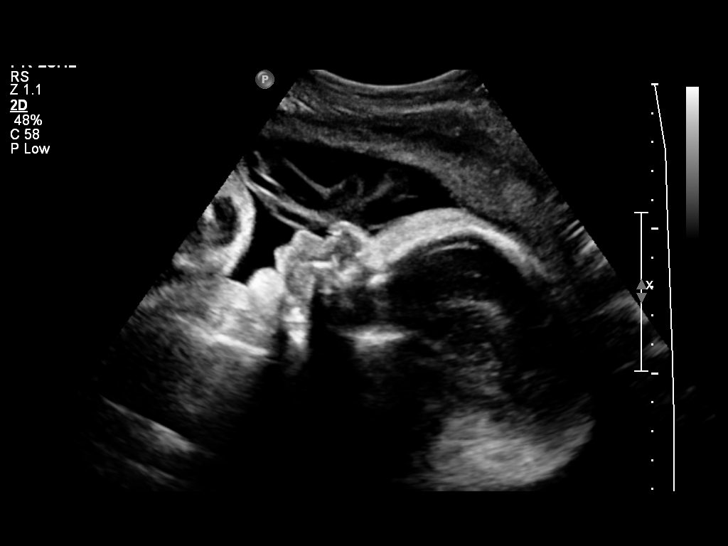
[im 18/26]
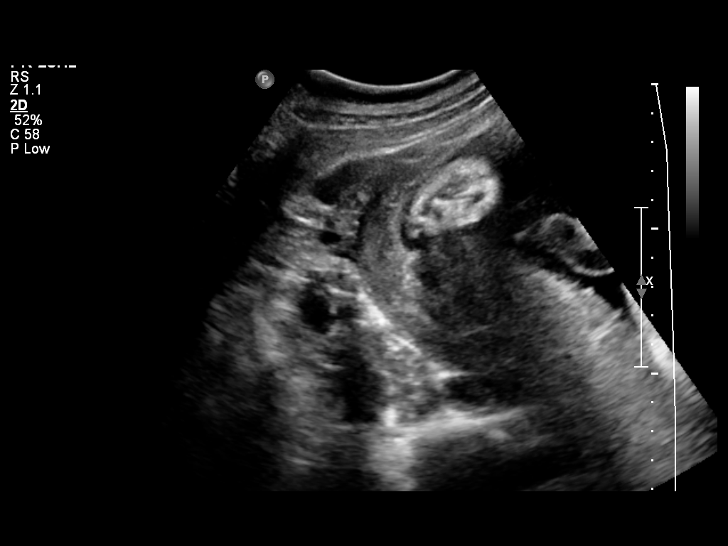
[im 20/26]
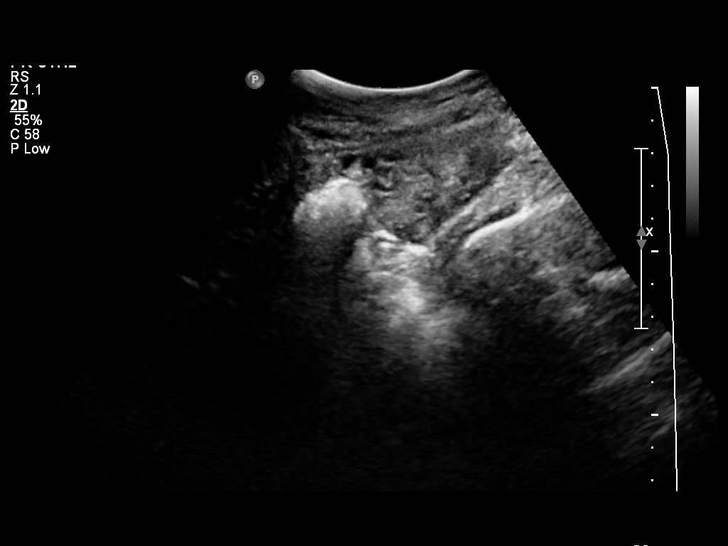
[im 22/26]
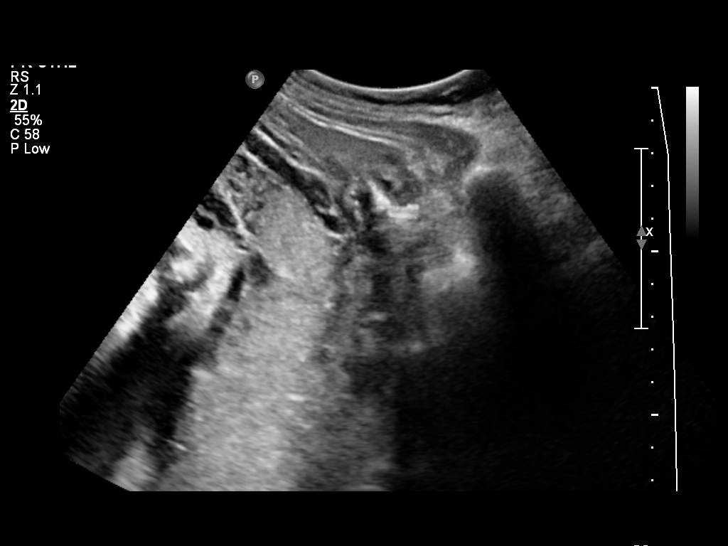
[im 24/26]
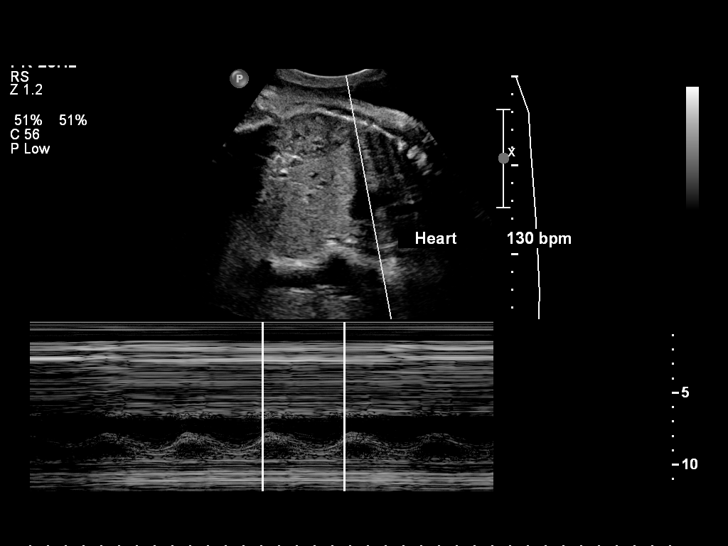

[13 of 28 positions shown; findings below may reference images not displayed]

OBSTETRICS REPORT
                      (Signed Final 04/20/2011 [DATE])

                 [facility]-
                 Faculty Physician
 Order#:         [id number]
                 31_O
Procedures

 [HOSPITAL]                                         76815.0
Indications

 Size greater than dates (Large for gestational [AGE]
 Polyhydramnios (per order)
 Assess fetal well being
 Assess amniotic fluid volume
Fetal Evaluation

 Fetal Heart Rate:  140                          bpm
 Cardiac Activity:  Observed
 Presentation:      Cephalic
 Placenta:          Posterior, above cervical
                    os
 P. Cord            Previously Visualized
 Insertion:

 Comment:    BPP score [DATE] in 30 minutes.

 Amniotic Fluid
 AFI FV:      Subjectively increased
 AFI Sum:     27.4    cm     > 97  %Tile     Larg Pckt:    8.95  cm
 RUQ:   8.95    cm   RLQ:    8.88   cm    LUQ:   3.29    cm   LLQ:    6.28   cm
Biophysical Evaluation

 Amniotic F.V:   Increased                  F. Tone:        Observed
 F. Movement:    Observed                   Score:          [DATE]
 F. Breathing:   Not Observed
Gestational Age

 LMP:           31w 4d        Date:  09/11/10                 EDD:   06/18/11
 Best:          32w 2d     Det. By:  U/S C R L   (11/17/10)   EDD:   06/13/11
Cervix Uterus Adnexa
 Cervical Length:    5.3      cm

 Cervix:       Normal appearance by transabdominal scan.
 Left Ovary:    Within normal limits.
 Right Ovary:   Within normal limits.

 Adnexa:     No abnormality visualized.
Impression

 Single living intrauterine fetus in Cephalic presentation.
 Amniotic fluid volume remains subjectively increased, with
 AFI of 27.4 cm (>97 %ile).
 Biophysical profile score of [DATE].

## 2013-07-25 ENCOUNTER — Emergency Department (HOSPITAL_COMMUNITY)
Admission: EM | Admit: 2013-07-25 | Discharge: 2013-07-25 | Disposition: A | Discharge status: Home / Self Care | Payer: Self-pay | Attending: Emergency Medicine | Admitting: Emergency Medicine

## 2013-07-25 ENCOUNTER — Encounter (HOSPITAL_COMMUNITY): Payer: Self-pay | Admitting: Emergency Medicine

## 2013-07-25 DIAGNOSIS — Z3202 Encounter for pregnancy test, result negative: Secondary | ICD-10-CM | POA: Insufficient documentation

## 2013-07-25 DIAGNOSIS — A599 Trichomoniasis, unspecified: Secondary | ICD-10-CM

## 2013-07-25 DIAGNOSIS — A5901 Trichomonal vulvovaginitis: Secondary | ICD-10-CM | POA: Insufficient documentation

## 2013-07-25 LAB — WET PREP, GENITAL: Clue Cells Wet Prep HPF POC: NONE SEEN

## 2013-07-25 LAB — URINALYSIS, ROUTINE W REFLEX MICROSCOPIC
Bilirubin Urine: NEGATIVE
Glucose, UA: NEGATIVE mg/dL
Ketones, ur: NEGATIVE mg/dL
Nitrite: NEGATIVE
Specific Gravity, Urine: 1.028 (ref 1.005–1.030)
pH: 6.5 (ref 5.0–8.0)

## 2013-07-25 LAB — URINE MICROSCOPIC-ADD ON

## 2013-07-25 LAB — POCT PREGNANCY, URINE: Preg Test, Ur: NEGATIVE

## 2013-07-25 MED ORDER — METRONIDAZOLE 500 MG PO TABS: 500.0000 mg | ORAL_TABLET | Freq: Two times a day (BID) | ORAL | Status: AC

## 2013-07-25 MED ORDER — FLUCONAZOLE 150 MG PO TABS: 150.0000 mg | ORAL_TABLET | Freq: Once | ORAL | Status: AC

## 2013-07-25 NOTE — ED Provider Notes (Signed)
CSN: [id number]     Arrival date & time 07/25/13  0802 History   First MD Initiated Contact with Patient 07/25/13 [phone]     Chief Complaint  Patient presents with  . Vaginal Discharge   (Consider location/radiation/quality/duration/timing/severity/associated sxs/prior Treatment) HPI Comments: Patient presents to the emergency department with chief complaint of vaginal discharge. She states that she has been having symptoms for the past 3 days. She endorses heavy discharge that is associated with mild itching. She has not tried anything to alleviate her symptoms. She denies fevers, chills, nausea, vomiting, diarrhea, constipation, or abdominal pain. She states that her last menstrual period was 10 days ago. She endorses having one new sexual contact.  The history is provided by the patient. No language interpreter was used.    Past Medical History  Diagnosis Date  . No pertinent past medical history    Past Surgical History  Procedure Laterality Date  . Tubal ligation     No family history on file. History  Substance Use Topics  . Smoking status: Never Smoker   . Smokeless tobacco: Not on file  . Alcohol Use: No   OB History   Grav Para Term Preterm Abortions TAB SAB Ect Mult Living   4 2   1 1    2      Review of Systems  All other systems reviewed and are negative.    Allergies  Review of patient's allergies indicates no known allergies.  Home Medications   Current Outpatient Rx  Name  Route  Sig  Dispense  Refill  . Ibuprofen (MOTRIN IB PO)   Oral   Take 2 tablets by mouth daily as needed (headache). For headache.          BP 121/77  Pulse 63  Temp(Src) 97.9 F (36.6 C)  Resp 16  SpO2 99% Physical Exam  Nursing note and vitals reviewed. Constitutional: She is oriented to person, place, and time. She appears well-developed and well-nourished.  HENT:  Head: Normocephalic and atraumatic.  Eyes: Conjunctivae and EOM are normal. Pupils are equal, round, and  reactive to light.  Neck: Normal range of motion. Neck supple.  Cardiovascular: Normal rate and regular rhythm.  Exam reveals no gallop and no friction rub.   No murmur heard. Pulmonary/Chest: Effort normal and breath sounds normal. No respiratory distress. She has no wheezes. She has no rales. She exhibits no tenderness.  Abdominal: Soft. Bowel sounds are normal. She exhibits no distension and no mass. There is no tenderness. There is no rebound and no guarding. Hernia confirmed negative in the right inguinal area and confirmed negative in the left inguinal area.  Genitourinary: No labial fusion. There is no rash, tenderness, lesion or injury on the right labia. There is no rash, tenderness, lesion or injury on the left labia. Uterus is not deviated, not enlarged, not fixed and not tender. Cervix exhibits no motion tenderness, no discharge and no friability. Right adnexum displays no mass, no tenderness and no fullness. Left adnexum displays no mass, no tenderness and no fullness. No erythema, tenderness or bleeding around the vagina. No foreign body around the vagina. No signs of injury around the vagina. Vaginal discharge found.  Chaperone present for exam  Musculoskeletal: Normal range of motion. She exhibits no edema and no tenderness.  Lymphadenopathy:       Right: No inguinal adenopathy present.       Left: No inguinal adenopathy present.  Neurological: She is alert and oriented to person, place,  and time.  Skin: Skin is warm and dry.  Psychiatric: She has a normal mood and affect. Her behavior is normal. Judgment and thought content normal.    ED Course  Procedures (including critical care time) Results for orders placed during the hospital encounter of 07/25/13  WET PREP, GENITAL      Result Value Range   Yeast Wet Prep HPF POC NONE SEEN  NONE SEEN   Trich, Wet Prep FEW (*) NONE SEEN   Clue Cells Wet Prep HPF POC NONE SEEN  NONE SEEN   WBC, Wet Prep HPF POC FEW (*) NONE SEEN   URINALYSIS, ROUTINE W REFLEX MICROSCOPIC      Result Value Range   Color, Urine YELLOW  YELLOW   APPearance CLOUDY (*) CLEAR   Specific Gravity, Urine 1.028  1.005 - 1.030   pH 6.5  5.0 - 8.0   Glucose, UA NEGATIVE  NEGATIVE mg/dL   Hgb urine dipstick NEGATIVE  NEGATIVE   Bilirubin Urine NEGATIVE  NEGATIVE   Ketones, ur NEGATIVE  NEGATIVE mg/dL   Protein, ur NEGATIVE  NEGATIVE mg/dL   Urobilinogen, UA 1.0  0.0 - 1.0 mg/dL   Nitrite NEGATIVE  NEGATIVE   Leukocytes, UA MODERATE (*) NEGATIVE  URINE MICROSCOPIC-ADD ON      Result Value Range   Squamous Epithelial / LPF FEW (*) RARE   WBC, UA 7-10  <3 WBC/hpf   Bacteria, UA FEW (*) RARE   Urine-Other MUCOUS PRESENT    POCT PREGNANCY, URINE      Result Value Range   Preg Test, Ur NEGATIVE  NEGATIVE      MDM   1. Trichimoniasis     Patient complaining of vaginal discharge. She does have moderate discharge on exam, there does not appear to be any external genitalia irritation or sores. Will check wet prep, GC Chlamydia. We'll likely treat for yeast infection, the patient's lab results are unremarkable. No dysuria, no fevers. Patient is not pregnant. She has good followup.  Wet prep shows trich.  Will treat with flagyl.  Will also give diflucan, as the patient states she gets frequent yeast infections.  Patient is stable and ready for discharge.  Follow-up and STD instructions given.    Roxy Horseman, PA-C 07/25/13 1133

## 2013-07-25 NOTE — ED Provider Notes (Signed)
Medical screening examination/treatment/procedure(s) were performed by non-physician practitioner and as supervising physician I was immediately available for consultation/collaboration.    Vida Roller, MD 07/25/13 [phone]

## 2013-07-25 NOTE — ED Notes (Signed)
Heavy vag d/c x 3 days lmp was  9/6 states has had tubes tied

## 2013-07-26 LAB — GC/CHLAMYDIA PROBE AMP: GC Probe RNA: NEGATIVE

## 2013-07-26 LAB — URINE CULTURE: Colony Count: >=100000

## 2013-07-27 NOTE — Progress Notes (Signed)
ED Antimicrobial Stewardship Positive Culture Follow Up   Leah Richardson is an 27 y.o. female who presented to [facility] on 07/25/2013 with a chief complaint of vaginal discharge x 3 days, mild itching  Chief Complaint  Patient presents with  . Vaginal Discharge    Recent Results (from the past 720 hour(s))  URINE CULTURE     Status: None   Collection Time    07/25/13  9:48 AM      Result Value Range Status   Specimen Description URINE, RANDOM   Final   Special Requests NONE   Final   Culture  Setup Time     Final   Value: 07/25/2013 13:17     Performed at [facility] Count     Final   Value: >=100,000 COLONIES/ML     Performed at [facility]   Culture     Final   Value: GROUP B STREP(S.AGALACTIAE)ISOLATED     Note: TESTING AGAINST S. AGALACTIAE NOT ROUTINELY PERFORMED DUE TO PREDICTABILITY OF AMP/PEN/VAN SUSCEPTIBILITY.     Performed at [facility]   Report Status 07/26/2013 FINAL   Final  WET PREP, GENITAL     Status: Abnormal   Collection Time    07/25/13 10:00 AM      Result Value Range Status   Yeast Wet Prep HPF POC NONE SEEN  NONE SEEN Final   Trich, Wet Prep FEW (*) NONE SEEN Final   Clue Cells Wet Prep HPF POC NONE SEEN  NONE SEEN Final   WBC, Wet Prep HPF POC FEW (*) NONE SEEN Final  GC/CHLAMYDIA PROBE AMP     Status: None   Collection Time    07/25/13 10:00 AM      Result Value Range Status   CT Probe RNA NEGATIVE  NEGATIVE Final   GC Probe RNA NEGATIVE  NEGATIVE Final   Comment: (NOTE)                                                                                              **Normal Reference Range: Negative**          Assay performed using the Gen-Probe APTIMA COMBO2 (R) Assay.     Acceptable specimen types for this assay include APTIMA Swabs (Unisex,     endocervical, urethral, or vaginal), first void urine, and ThinPrep     liquid based cytology samples.     Performed at [facility]    [x]  No  treatment indicated  27 y.o. F who presented with vaginal discharge x 3 days, mild itching. Work-up revealed trichomonas and the patient was given Flagyl. The patient was also given Flagyl due to history of frequent yeast infections. UCx grew Group B Strept -- the patient is not pregnant (tubal ligation) and is likely colonized -- would not treat.  New antibiotic prescription: No treatment indicated  ED Provider: Roxy Horseman, PA-C  Rolley Sims 07/27/2013, 10:19 AM Infectious Diseases Pharmacist Phone# [phone]

## 2014-09-09 ENCOUNTER — Encounter (HOSPITAL_COMMUNITY): Payer: Self-pay | Admitting: Emergency Medicine

## 2017-11-11 ENCOUNTER — Emergency Department (HOSPITAL_COMMUNITY)
Admission: EM | Admit: 2017-11-11 | Discharge: 2017-11-11 | Disposition: A | Discharge status: Home / Self Care | Payer: Self-pay | Attending: Emergency Medicine | Admitting: Emergency Medicine

## 2017-11-11 ENCOUNTER — Other Ambulatory Visit: Payer: Self-pay

## 2017-11-11 ENCOUNTER — Encounter (HOSPITAL_COMMUNITY): Payer: Self-pay | Admitting: *Deleted

## 2017-11-11 ENCOUNTER — Encounter (HOSPITAL_COMMUNITY): Payer: Self-pay | Admitting: Emergency Medicine

## 2017-11-11 DIAGNOSIS — K0889 Other specified disorders of teeth and supporting structures: Secondary | ICD-10-CM | POA: Insufficient documentation

## 2017-11-11 DIAGNOSIS — K029 Dental caries, unspecified: Secondary | ICD-10-CM | POA: Insufficient documentation

## 2017-11-11 DIAGNOSIS — Z79899 Other long term (current) drug therapy: Secondary | ICD-10-CM | POA: Insufficient documentation

## 2017-11-11 DIAGNOSIS — Z5321 Procedure and treatment not carried out due to patient leaving prior to being seen by health care provider: Secondary | ICD-10-CM | POA: Insufficient documentation

## 2017-11-11 MED ORDER — NAPROXEN 375 MG PO TABS: 375.0000 mg | ORAL_TABLET | Freq: Two times a day (BID) | ORAL | 0 refills | Status: AC

## 2017-11-11 MED ORDER — PENICILLIN V POTASSIUM 500 MG PO TABS: 500.0000 mg | ORAL_TABLET | Freq: Four times a day (QID) | ORAL | 0 refills | Status: AC

## 2017-11-11 MED ORDER — OXYCODONE-ACETAMINOPHEN 5-325 MG PO TABS
1.0000 | ORAL_TABLET | ORAL | Status: DC | PRN
  Administered 2017-11-11: 1 via ORAL
  Filled 2017-11-11: qty 1

## 2017-11-11 NOTE — ED Notes (Signed)
No answer for room X2 

## 2017-11-11 NOTE — ED Provider Notes (Signed)
[facility]   CSN: [id number] Arrival date & time: 11/11/17  0756     History   Chief Complaint Chief Complaint  Patient presents with  . Dental Pain    HPI Leah Richardson is a 32 y.o. female.  Leah Richardson is a 32 y.o. Female who presents to the emergency department complaining of left lower dental pain ongoing for the past 4 days.  She reports her pain is in her left lower molar where she had a filling.  She is scheduled to follow-up with dentist Dr. Chilton SiGreen in about 5 days.  She has been taking ibuprofen with some relief of her symptoms.  She is had no discharge from her mouth.  No sore throat or trouble swallowing.  No fevers.   The history is provided by the patient and medical records. No language interpreter was used.  Dental Pain      Past Medical History:  Diagnosis Date  . No pertinent past medical history     There are no active problems to display for this patient.   Past Surgical History:  Procedure Laterality Date  . TUBAL LIGATION      OB History    Gravida Para Term Preterm AB Living   4 2     1 2    SAB TAB Ectopic Multiple Live Births     1             Home Medications    Prior to Admission medications   Medication Sig Start Date End Date Taking? Authorizing Provider  fluconazole (DIFLUCAN) 150 MG tablet Take 1 tablet (150 mg total) by mouth once. Take 1 pill today and the 2nd pill 72 hours later. 07/25/13   Roxy HorsemanBrowning, Robert, PA-C  metroNIDAZOLE (FLAGYL) 500 MG tablet Take 1 tablet (500 mg total) by mouth 2 (two) times daily. 07/25/13   Roxy HorsemanBrowning, Robert, PA-C  naproxen (NAPROSYN) 375 MG tablet Take 1 tablet (375 mg total) by mouth 2 (two) times daily with a meal. 11/11/17   Everlene Farrieransie, Lachina Salsberry, PA-C  penicillin v potassium (VEETID) 500 MG tablet Take 1 tablet (500 mg total) by mouth 4 (four) times daily. 11/11/17   Everlene Farrieransie, Carmaleta Youngers, PA-C    Family History History reviewed. No pertinent family  history.  Social History Social History   Tobacco Use  . Smoking status: Never Smoker  Substance Use Topics  . Alcohol use: No  . Drug use: No     Allergies   Patient has no known allergies.   Review of Systems Review of Systems  Constitutional: Negative for chills and fever.  HENT: Positive for dental problem. Negative for drooling, ear pain, facial swelling, sore throat and trouble swallowing.   Eyes: Negative for pain and visual disturbance.  Musculoskeletal: Negative for neck pain and neck stiffness.  Skin: Negative for rash.  Neurological: Negative for headaches.     Physical Exam Updated Vital Signs BP (!) 140/106 (BP Location: Right Arm)   Pulse 76   Temp 98.7 F (37.1 C) (Oral)   Resp 18   LMP 10/20/2017   SpO2 97%   Physical Exam  Constitutional: She appears well-developed and well-nourished. No distress.  Non-toxic appearing.   HENT:  Head: Normocephalic and atraumatic.  Right Ear: External ear normal.  Left Ear: External ear normal.  Mouth/Throat: Oropharynx is clear and moist. No oropharyngeal exudate.  Tenderness to left lower molar. Dental caries.  No discharge from the mouth. No facial swelling.  Uvula is midline without edema. Soft palate rises symmetrically. No tonsillar hypertrophy or exudates. Tongue protrusion is normal. No trismus. No PTA.   Eyes: Conjunctivae are normal. Pupils are equal, round, and reactive to light. Right eye exhibits no discharge. Left eye exhibits no discharge.  Neck: Normal range of motion. Neck supple. No JVD present. No tracheal deviation present.  Cardiovascular: Normal rate and intact distal pulses.  Pulmonary/Chest: Effort normal. No respiratory distress.  Lymphadenopathy:    She has no cervical adenopathy.  Neurological: Coordination normal.  Skin: Skin is warm and dry. Capillary refill takes less than 2 seconds. No rash noted. She is not diaphoretic. No erythema. No pallor.  Psychiatric: She has a normal mood  and affect. Her behavior is normal.  Nursing note and vitals reviewed.    ED Treatments / Results  Labs (all labs ordered are listed, but only abnormal results are displayed) Labs Reviewed - No data to display  EKG  EKG Interpretation None       Radiology No results found.  Procedures Procedures (including critical care time)  Medications Ordered in ED Medications  oxyCODONE-acetaminophen (PERCOCET/ROXICET) 5-325 MG per tablet 1 tablet (1 tablet Oral Given 11/11/17 0817)     Initial Impression / Assessment and Plan / ED Course  I have reviewed the triage vital signs and the nursing notes.  Pertinent labs & imaging results that were available during my care of the patient were reviewed by me and considered in my medical decision making (see chart for details).     This is a 32 y.o. Female who presents to the emergency department complaining of left lower dental pain ongoing for the past 4 days.  She reports her pain is in her left lower molar where she had a filling.  She is scheduled to follow-up with dentist Dr. Chilton Si in about 5 days.  Patient with toothache.  No gross abscess.  Exam unconcerning for Ludwig's angina or spread of infection.  Will treat with penicillin and pain medicine.  Urged patient to follow-up with dentist.   I advised the patient to follow-up with their primary care provider this week. I advised the patient to return to the emergency department with new or worsening symptoms or new concerns. The patient verbalized understanding and agreement with plan.     Final Clinical Impressions(s) / ED Diagnoses   Final diagnoses:  Pain, dental    ED Discharge Orders        Ordered    naproxen (NAPROSYN) 375 MG tablet  2 times daily with meals     11/11/17 0921    penicillin v potassium (VEETID) 500 MG tablet  4 times daily     11/11/17 0921       Everlene Farrier, PA-C 11/11/17 4098    Jacalyn Lefevre, MD 11/11/17 [phone]

## 2017-11-11 NOTE — ED Triage Notes (Signed)
Pt reports left upper dental pain x 4 days with mild swelling. airway intact at triage.

## 2017-11-11 NOTE — ED Triage Notes (Signed)
Pt c/o dental pain and a broken tooth. States she was seen earlier today with same and prescribed antibiotics, since taking medications pt c/o burning in her teeth.

## 2018-02-04 ENCOUNTER — Emergency Department (HOSPITAL_COMMUNITY)
Admission: EM | Admit: 2018-02-04 | Discharge: 2018-02-04 | Disposition: A | Discharge status: Home / Self Care | Payer: Self-pay | Attending: Emergency Medicine | Admitting: Emergency Medicine

## 2018-02-04 ENCOUNTER — Encounter (HOSPITAL_COMMUNITY): Payer: Self-pay | Admitting: Emergency Medicine

## 2018-02-04 DIAGNOSIS — B9789 Other viral agents as the cause of diseases classified elsewhere: Secondary | ICD-10-CM | POA: Insufficient documentation

## 2018-02-04 DIAGNOSIS — J069 Acute upper respiratory infection, unspecified: Secondary | ICD-10-CM | POA: Insufficient documentation

## 2018-02-04 LAB — RAPID STREP SCREEN (MED CTR MEBANE ONLY): Streptococcus, Group A Screen (Direct): NEGATIVE

## 2018-02-04 NOTE — ED Provider Notes (Signed)
[facility]   CSN: [id number] Arrival date & time: 02/04/18  1202     History   Chief Complaint Chief Complaint  Patient presents with  . Sore Throat  . Arm Pain    HPI Leah Richardson is a 32 y.o. female without significant PMHx, presenting to the ED with 4 days of sore throat. Pt states she has some pain with swallowing. Reports assoc nasal congestion, and a new dry cough that started today. Has been taking OTC cold medications with improvement. Denies difficulty swallowing or breathing, fever, ear pain, or other complaints today.  The history is provided by the patient.    Past Medical History:  Diagnosis Date  . No pertinent past medical history     There are no active problems to display for this patient.   Past Surgical History:  Procedure Laterality Date  . TUBAL LIGATION       OB History    Gravida  4   Para  2   Term      Preterm      AB  1   Living  2     SAB      TAB  1   Ectopic      Multiple      Live Births               Home Medications    Prior to Admission medications   Medication Sig Start Date End Date Taking? Authorizing Provider  fluconazole (DIFLUCAN) 150 MG tablet Take 1 tablet (150 mg total) by mouth once. Take 1 pill today and the 2nd pill 72 hours later. 07/25/13   Roxy HorsemanBrowning, Robert, PA-C  metroNIDAZOLE (FLAGYL) 500 MG tablet Take 1 tablet (500 mg total) by mouth 2 (two) times daily. 07/25/13   Roxy HorsemanBrowning, Robert, PA-C  naproxen (NAPROSYN) 375 MG tablet Take 1 tablet (375 mg total) by mouth 2 (two) times daily with a meal. 11/11/17   Everlene Farrieransie, William, PA-C  penicillin v potassium (VEETID) 500 MG tablet Take 1 tablet (500 mg total) by mouth 4 (four) times daily. 11/11/17   Everlene Farrieransie, William, PA-C    Family History History reviewed. No pertinent family history.  Social History Social History   Tobacco Use  . Smoking status: Never Smoker  . Smokeless tobacco: Never  Used  Substance Use Topics  . Alcohol use: No  . Drug use: No     Allergies   Patient has no known allergies.   Review of Systems Review of Systems  Constitutional: Negative for chills and fever.  HENT: Positive for congestion and sore throat. Negative for ear pain, trouble swallowing and voice change.   Respiratory: Positive for cough. Negative for shortness of breath.   All other systems reviewed and are negative.    Physical Exam Updated Vital Signs BP 122/82 (BP Location: Right Arm)   Pulse 92   Temp 98.7 F (37.1 C) (Oral)   Resp 14   Ht 5\' 3"  (1.6 m)   Wt 71.7 kg (158 lb)   LMP 01/28/2018   SpO2 100%   BMI 27.99 kg/m   Physical Exam  Constitutional: She appears well-developed and well-nourished. She does not appear ill. No distress.  Well-appearing, tolerating secretions.  HENT:  Head: Normocephalic and atraumatic.  Right Ear: Hearing, tympanic membrane and ear canal normal.  Left Ear: Hearing, tympanic membrane and ear canal normal.  Mouth/Throat: Uvula is midline. No trismus in the jaw. No uvula  swelling. Posterior oropharyngeal erythema present. No posterior oropharyngeal edema. No tonsillar exudate.  Eyes: Conjunctivae are normal.  Neck: Normal range of motion. Neck supple.  Cardiovascular: Normal rate, regular rhythm, normal heart sounds and intact distal pulses.  Pulmonary/Chest: Effort normal and breath sounds normal. No stridor. No respiratory distress. She has no wheezes. She has no rales.  Lymphadenopathy:    She has no cervical adenopathy.  Psychiatric: She has a normal mood and affect. Her behavior is normal.  Nursing note and vitals reviewed.    ED Treatments / Results  Labs (all labs ordered are listed, but only abnormal results are displayed) Labs Reviewed  RAPID STREP SCREEN (NOT AT [facility])  CULTURE, GROUP A STREP [facility])    EKG None  Radiology No results found.  Procedures Procedures (including critical care time)  Medications  Ordered in ED Medications - No data to display   Initial Impression / Assessment and Plan / ED Course  I have reviewed the triage vital signs and the nursing notes.  Pertinent labs & imaging results that were available during my care of the patient were reviewed by me and considered in my medical decision making (see chart for details).     Patients symptoms are consistent with URI, likely viral etiology. Afebrile, tolerating secretions.  Lungs clear to auscultation bilaterally. Discussed that antibiotics are not indicated for viral infections. Pt will be discharged with symptomatic treatment.  Verbalizes understanding and is agreeable with plan. Pt is hemodynamically stable & in NAD prior to dc.  Discussed results, findings, treatment and follow up. Patient advised of return precautions. Patient verbalized understanding and agreed with plan.  Final Clinical Impressions(s) / ED Diagnoses   Final diagnoses:  Viral URI    ED Discharge Orders    None       Williamson Cavanah, [place] N, PA-C 02/04/18 1540    Doug Sou, MD 02/04/18 1710

## 2018-02-04 NOTE — ED Triage Notes (Signed)
Pt to ER for evaluation of sore throat x4 days. Reports dry cough. Reports pain to right arm, mainly in the shoulder with ROM, states that began this morning. Pt in NAD.

## 2018-02-04 NOTE — Discharge Instructions (Signed)
Please read instructions below. You can take tylenol as needed for sore throat or body aches. Drink plenty of water. Follow up with your primary care provider as needed. Return to the ER for difficulty swallowing liquids, difficulty breathing, or new or worsening symptoms. ° °

## 2018-02-06 LAB — CULTURE, GROUP A STREP (THRC)

## 2019-09-01 ENCOUNTER — Encounter (HOSPITAL_COMMUNITY): Payer: Self-pay | Admitting: *Deleted

## 2019-09-01 ENCOUNTER — Ambulatory Visit (HOSPITAL_COMMUNITY)
Admission: EM | Admit: 2019-09-01 | Discharge: 2019-09-01 | Disposition: A | Discharge status: Home / Self Care | Payer: Self-pay | Attending: Family Medicine | Admitting: Family Medicine

## 2019-09-01 ENCOUNTER — Other Ambulatory Visit: Payer: Self-pay

## 2019-09-01 DIAGNOSIS — N390 Urinary tract infection, site not specified: Secondary | ICD-10-CM | POA: Insufficient documentation

## 2019-09-01 LAB — POCT URINALYSIS DIP (DEVICE)
Bilirubin Urine: NEGATIVE
Glucose, UA: NEGATIVE mg/dL
Ketones, ur: NEGATIVE mg/dL
Nitrite: NEGATIVE
Protein, ur: NEGATIVE mg/dL
Specific Gravity, Urine: >=1.03 (ref 1.005–1.030)
Urobilinogen, UA: 0.2 mg/dL (ref 0.0–1.0)
pH: 6.5 (ref 5.0–8.0)

## 2019-09-01 MED ORDER — NITROFURANTOIN MONOHYD MACRO 100 MG PO CAPS: 100.0000 mg | ORAL_CAPSULE | Freq: Two times a day (BID) | ORAL | 0 refills | Status: AC

## 2019-09-01 NOTE — Discharge Instructions (Signed)
Will call with results of vaginal swab testing

## 2019-09-01 NOTE — ED Triage Notes (Signed)
C/O dysuria, polyuria x 5 days; yesterday started with vaginal discharge.  Denies fevers, denies abd pain.

## 2019-09-01 NOTE — ED Provider Notes (Signed)
[place]    CSN: [id number] Arrival date & time: 09/01/19  1103      History   Chief Complaint Chief Complaint  Patient presents with  . Appointment    1110  . Vaginal Discharge  . Dysuria    HPI Leah Richardson is a 33 y.o. female.   Patient began with lower urinary symptoms of frequency and dysuria about 5 days ago.  She has not had any fever chills or back pain nausea or vomiting.  Yesterday started having some vaginal discharge.  Denies itching or odor.  Past history of UTIs when she was pregnant and as a young girl.  HPI  History reviewed. No pertinent past medical history.  There are no active problems to display for this patient.   Past Surgical History:  Procedure Laterality Date  . TUBAL LIGATION      OB History    Gravida  4   Para  2   Term      Preterm      AB  1   Living  2     SAB      TAB  1   Ectopic      Multiple      Live Births               Home Medications    Prior to Admission medications   Medication Sig Start Date End Date Taking? Authorizing Provider  fluconazole (DIFLUCAN) 150 MG tablet Take 1 tablet (150 mg total) by mouth once. Take 1 pill today and the 2nd pill 72 hours later. 07/25/13   Montine Circle, PA-C  metroNIDAZOLE (FLAGYL) 500 MG tablet Take 1 tablet (500 mg total) by mouth 2 (two) times daily. 07/25/13   Montine Circle, PA-C  naproxen (NAPROSYN) 375 MG tablet Take 1 tablet (375 mg total) by mouth 2 (two) times daily with a meal. 11/11/17   Waynetta Pean, PA-C  penicillin v potassium (VEETID) 500 MG tablet Take 1 tablet (500 mg total) by mouth 4 (four) times daily. 11/11/17   Waynetta Pean, PA-C    Family History Family History  Problem Relation Age of Onset  . Hypertension Mother   . Healthy Father     Social History Social History   Tobacco Use  . Smoking status: Never Smoker  . Smokeless tobacco: Never Used  Substance Use Topics  . Alcohol use: No  . Drug use: Yes   Types: Marijuana     Allergies   Patient has no known allergies.   Review of Systems Review of Systems  Endocrine: Positive for polyuria.  Genitourinary: Positive for dysuria and vaginal discharge.  All other systems reviewed and are negative.    Physical Exam Triage Vital Signs ED Triage Vitals  Enc Vitals Group     BP 09/01/19 1127 131/75     Pulse Rate 09/01/19 1127 83     Resp 09/01/19 1127 16     Temp 09/01/19 1127 98.8 F (37.1 C)     Temp Source 09/01/19 1127 Oral     SpO2 09/01/19 1127 98 %     Weight --      Height --      Head Circumference --      Peak Flow --      Pain Score 09/01/19 1129 0     Pain Loc --      Pain Edu? --      Excl. in GC? --    No  data found.  Updated Vital Signs BP 131/75   Pulse 83   Temp 98.8 F (37.1 C) (Oral)   Resp 16   LMP 08/18/2019 (Approximate)   SpO2 98%   Breastfeeding No   Visual Acuity Right Eye Distance:   Left Eye Distance:   Bilateral Distance:    Right Eye Near:   Left Eye Near:    Bilateral Near:     Physical Exam Vitals signs and nursing note reviewed.  Constitutional:      Appearance: Normal appearance. She is normal weight.  Abdominal:     General: Abdomen is flat. Bowel sounds are normal.     Palpations: Abdomen is soft.     Tenderness: There is no abdominal tenderness. There is no guarding.     Comments: No CVA tenderness  Neurological:     Mental Status: She is alert.      UC Treatments / Results  Labs (all labs ordered are listed, but only abnormal results are displayed) Labs Reviewed - No data to display  EKG   Radiology No results found.  Procedures Procedures (including critical care time)  Medications Ordered in UC Medications - No data to display  Initial Impression / Assessment and Plan / UC Course  I have reviewed the triage vital signs and the nursing notes.  Pertinent labs & imaging results that were available during my care of the patient were reviewed by me  and considered in my medical decision making (see chart for details).     UTI.  Vaginitis. Final Clinical Impressions(s) / UC Diagnoses   Final diagnoses:  None   Discharge Instructions   None    ED Prescriptions    None     PDMP not reviewed this encounter.   Frederica Kuster, MD 09/01/19 [phone]

## 2019-09-04 ENCOUNTER — Telehealth (HOSPITAL_COMMUNITY): Payer: Self-pay | Admitting: Emergency Medicine

## 2019-09-04 ENCOUNTER — Encounter (HOSPITAL_COMMUNITY): Payer: Self-pay

## 2019-09-04 LAB — CYTOLOGY, (ORAL, ANAL, URETHRAL) ANCILLARY ONLY
Chlamydia: NEGATIVE
Neisseria Gonorrhea: POSITIVE — AB
Trichomonas: POSITIVE — AB

## 2019-09-04 NOTE — Telephone Encounter (Signed)
Gonorrhea and trichomonas is positive.  Patient should return as soon as possible to the urgent care for treatment with IM rocephin 250mg  and po zithromax 1g and 2g flagyl. Patient will not need to see a provider unless there are new symptoms she would like evaluated. Pt needs education to refrain from sexual intercourse for now and for 7 days after treatment to give the medicine time to work. Sexual partners need to be notified and tested/treated. Condoms may reduce risk of reinfection. [facility] notified.   Attempted to reach patient. No answer at this time. Voicemail left.

## 2019-09-04 NOTE — Telephone Encounter (Signed)
Number in chart is wrong number. No working number on file. Letter sent.

## 2019-09-06 ENCOUNTER — Telehealth (HOSPITAL_COMMUNITY): Payer: Self-pay | Admitting: Emergency Medicine

## 2019-09-06 ENCOUNTER — Other Ambulatory Visit: Payer: Self-pay

## 2019-09-06 ENCOUNTER — Encounter (HOSPITAL_COMMUNITY): Payer: Self-pay

## 2019-09-06 ENCOUNTER — Ambulatory Visit (HOSPITAL_COMMUNITY)
Admission: EM | Admit: 2019-09-06 | Discharge: 2019-09-06 | Disposition: A | Discharge status: Home / Self Care | Payer: Self-pay | Attending: Family Medicine | Admitting: Family Medicine

## 2019-09-06 DIAGNOSIS — A549 Gonococcal infection, unspecified: Secondary | ICD-10-CM

## 2019-09-06 MED ORDER — AZITHROMYCIN 250 MG PO TABS
ORAL_TABLET | ORAL | Status: AC
  Filled 2019-09-06: qty 4

## 2019-09-06 MED ORDER — CEFTRIAXONE SODIUM 250 MG IJ SOLR
INTRAMUSCULAR | Status: AC
Start: 2019-09-06 — End: ?
  Filled 2019-09-06: qty 250

## 2019-09-06 MED ORDER — METRONIDAZOLE 500 MG PO TABS: 2000.0000 mg | ORAL_TABLET | Freq: Once | ORAL | 0 refills | Status: AC

## 2019-09-06 MED ORDER — AZITHROMYCIN 250 MG PO TABS
1000.0000 mg | ORAL_TABLET | Freq: Once | ORAL | Status: AC
  Administered 2019-09-06: 11:00:00 1000 mg via ORAL

## 2019-09-06 MED ORDER — CEFTRIAXONE SODIUM 250 MG IJ SOLR
250.0000 mg | Freq: Once | INTRAMUSCULAR | Status: AC
  Administered 2019-09-06: 11:00:00 250 mg via INTRAMUSCULAR

## 2019-09-06 NOTE — Telephone Encounter (Signed)
Pt needs treatment for trich, will send 2 grams flagyl to perferred pharmacy. Patient contacted and made aware of    results, all questions answered

## 2019-09-06 NOTE — ED Triage Notes (Signed)
Per Dr Mannie Stabile and standing order pt presented for treatment of STDs

## 2019-09-06 NOTE — Telephone Encounter (Signed)
Patient received my mychart message, returned my call. Pt will return today for treatment.

## 2022-04-05 ENCOUNTER — Ambulatory Visit (HOSPITAL_COMMUNITY): Payer: Self-pay

## 2022-09-20 ENCOUNTER — Ambulatory Visit: Payer: Self-pay

## 2023-01-24 ENCOUNTER — Ambulatory Visit (HOSPITAL_COMMUNITY): Payer: Self-pay

## 2024-02-04 ENCOUNTER — Ambulatory Visit (HOSPITAL_COMMUNITY): Payer: Self-pay
